# Patient Record
Sex: Female | Born: 1995 | Race: Black or African American | Hispanic: No | Smoking: Never smoker
Health system: Southern US, Community
[De-identification: ages and names within clinical notes are randomized; demographics above are authoritative.]

## PROBLEM LIST (undated history)

## (undated) ENCOUNTER — Ambulatory Visit: Source: Home / Self Care

## (undated) DIAGNOSIS — F909 Attention-deficit hyperactivity disorder, unspecified type: Secondary | ICD-10-CM

## (undated) HISTORY — PX: NO PAST SURGERIES: SHX2092

## (undated) HISTORY — DX: Attention-deficit hyperactivity disorder, unspecified type: F90.9

---
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
## 2016-02-16 ENCOUNTER — Ambulatory Visit (INDEPENDENT_AMBULATORY_CARE_PROVIDER_SITE_OTHER): Payer: Federal, State, Local not specified - PPO | Admitting: *Deleted

## 2016-02-16 DIAGNOSIS — Z308 Encounter for other contraceptive management: Secondary | ICD-10-CM | POA: Diagnosis not present

## 2016-02-16 MED ORDER — MEDROXYPROGESTERONE ACETATE 150 MG/ML IM SUSP
150.0000 mg | Freq: Once | INTRAMUSCULAR | Status: AC
  Administered 2016-02-16: 150 mg via INTRAMUSCULAR

## 2016-02-17 DIAGNOSIS — K080 Exfoliation of teeth due to systemic causes: Secondary | ICD-10-CM | POA: Diagnosis not present

## 2016-05-03 ENCOUNTER — Ambulatory Visit (INDEPENDENT_AMBULATORY_CARE_PROVIDER_SITE_OTHER): Payer: Federal, State, Local not specified - PPO | Admitting: Family Medicine

## 2016-05-03 DIAGNOSIS — Z3042 Encounter for surveillance of injectable contraceptive: Secondary | ICD-10-CM

## 2016-05-03 MED ORDER — MEDROXYPROGESTERONE ACETATE 150 MG/ML IM SUSY
150.0000 mg | PREFILLED_SYRINGE | Freq: Once | INTRAMUSCULAR | Status: AC
  Administered 2016-05-03: 150 mg via INTRAMUSCULAR

## 2016-05-03 NOTE — Progress Notes (Signed)
IM depo

## 2016-05-08 ENCOUNTER — Ambulatory Visit: Payer: Federal, State, Local not specified - PPO

## 2016-07-19 ENCOUNTER — Ambulatory Visit (INDEPENDENT_AMBULATORY_CARE_PROVIDER_SITE_OTHER): Payer: Federal, State, Local not specified - PPO | Admitting: *Deleted

## 2016-07-19 DIAGNOSIS — Z3042 Encounter for surveillance of injectable contraceptive: Secondary | ICD-10-CM | POA: Diagnosis not present

## 2016-07-19 MED ORDER — MEDROXYPROGESTERONE ACETATE 150 MG/ML IM SUSP
150.0000 mg | Freq: Once | INTRAMUSCULAR | Status: AC
  Administered 2016-07-19: 150 mg via INTRAMUSCULAR

## 2016-07-25 ENCOUNTER — Encounter: Payer: Self-pay | Admitting: Family Medicine

## 2016-07-25 ENCOUNTER — Ambulatory Visit (INDEPENDENT_AMBULATORY_CARE_PROVIDER_SITE_OTHER): Payer: Federal, State, Local not specified - PPO | Admitting: Family Medicine

## 2016-07-25 VITALS — BP 102/68 | HR 71 | Temp 98.8°F | Ht 68.75 in | Wt 142.5 lb

## 2016-07-25 DIAGNOSIS — L03115 Cellulitis of right lower limb: Secondary | ICD-10-CM | POA: Diagnosis not present

## 2016-07-25 MED ORDER — DOXYCYCLINE HYCLATE 100 MG PO TABS: 100.0000 mg | ORAL_TABLET | Freq: Two times a day (BID) | ORAL | 0 refills | Status: DC

## 2016-07-25 NOTE — Progress Notes (Signed)
Pre visit review using our clinic review tool, if applicable. No additional management support is needed unless otherwise documented below in the visit note. 

## 2016-07-25 NOTE — Patient Instructions (Signed)
You have a skin infection -take the doxycycline twice daily for 10 days  Keep the area clean with soap and water  Cover lightly  Antibiotic ointment is ok  It may drain more  If it increases in size/pain/ redness or drainage- alert us  Keep leg elevated when you can  Try a gentle warm compress on it for 10 minutes at a time when you can   Update if not starting to improve in a week or if worsening
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
## 2016-07-28 NOTE — Assessment & Plan Note (Signed)
Area of induration and erythema w/o fluctuance or drainage on lateral lower R leg  tx with doxycycline  Warm compress and elevation prn  Update if not starting to improve in a week or if worsening  -esp if inc in size/pain or if fever develops

## 2016-10-02 DIAGNOSIS — K080 Exfoliation of teeth due to systemic causes: Secondary | ICD-10-CM | POA: Diagnosis not present

## 2016-10-22 ENCOUNTER — Telehealth: Payer: Self-pay | Admitting: Family Medicine

## 2016-10-22 NOTE — Telephone Encounter (Signed)
Pt called to find out when she needs to schedule her next depo shot.  Can you please call her.

## 2016-10-22 NOTE — Telephone Encounter (Signed)
Spoke to pt and advised of next injection. Pt is outside of window and was advised a pregnancy test will be required; injection scheduled.

## 2016-10-25 ENCOUNTER — Ambulatory Visit (INDEPENDENT_AMBULATORY_CARE_PROVIDER_SITE_OTHER): Payer: Federal, State, Local not specified - PPO

## 2016-10-25 ENCOUNTER — Encounter (INDEPENDENT_AMBULATORY_CARE_PROVIDER_SITE_OTHER): Payer: Self-pay

## 2016-10-25 ENCOUNTER — Ambulatory Visit: Payer: Federal, State, Local not specified - PPO

## 2016-10-25 DIAGNOSIS — Z3202 Encounter for pregnancy test, result negative: Secondary | ICD-10-CM

## 2016-10-25 DIAGNOSIS — Z3042 Encounter for surveillance of injectable contraceptive: Secondary | ICD-10-CM | POA: Diagnosis not present

## 2016-10-25 LAB — POCT URINE PREGNANCY: Preg Test, Ur: NEGATIVE

## 2016-10-25 MED ORDER — MEDROXYPROGESTERONE ACETATE 150 MG/ML IM SUSY
150.0000 mg | PREFILLED_SYRINGE | Freq: Once | INTRAMUSCULAR | Status: AC
  Administered 2016-10-25: 150 mg via INTRAMUSCULAR

## 2016-11-20 ENCOUNTER — Encounter (HOSPITAL_COMMUNITY): Payer: Self-pay | Admitting: Emergency Medicine

## 2016-11-20 ENCOUNTER — Emergency Department (HOSPITAL_COMMUNITY)
Admission: EM | Admit: 2016-11-20 | Discharge: 2016-11-21 | Disposition: A | Discharge status: Home / Self Care | Payer: No Typology Code available for payment source | Attending: Emergency Medicine | Admitting: Emergency Medicine

## 2016-11-20 DIAGNOSIS — Z79899 Other long term (current) drug therapy: Secondary | ICD-10-CM | POA: Insufficient documentation

## 2016-11-20 DIAGNOSIS — F909 Attention-deficit hyperactivity disorder, unspecified type: Secondary | ICD-10-CM | POA: Insufficient documentation

## 2016-11-20 DIAGNOSIS — M7918 Myalgia, other site: Secondary | ICD-10-CM

## 2016-11-20 DIAGNOSIS — M549 Dorsalgia, unspecified: Secondary | ICD-10-CM | POA: Diagnosis not present

## 2016-11-20 DIAGNOSIS — Y939 Activity, unspecified: Secondary | ICD-10-CM | POA: Diagnosis not present

## 2016-11-20 DIAGNOSIS — Y999 Unspecified external cause status: Secondary | ICD-10-CM | POA: Insufficient documentation

## 2016-11-20 DIAGNOSIS — S299XXA Unspecified injury of thorax, initial encounter: Secondary | ICD-10-CM | POA: Insufficient documentation

## 2016-11-20 DIAGNOSIS — Y92410 Unspecified street and highway as the place of occurrence of the external cause: Secondary | ICD-10-CM | POA: Diagnosis not present

## 2016-11-20 DIAGNOSIS — M546 Pain in thoracic spine: Secondary | ICD-10-CM | POA: Diagnosis not present
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
## 2016-11-21 ENCOUNTER — Emergency Department (HOSPITAL_COMMUNITY): Payer: No Typology Code available for payment source

## 2016-11-21 DIAGNOSIS — M549 Dorsalgia, unspecified: Secondary | ICD-10-CM | POA: Diagnosis not present

## 2016-11-21 DIAGNOSIS — S299XXA Unspecified injury of thorax, initial encounter: Secondary | ICD-10-CM | POA: Diagnosis not present

## 2016-11-21 MED ORDER — IBUPROFEN 800 MG PO TABS: 800.0000 mg | ORAL_TABLET | Freq: Three times a day (TID) | ORAL | 0 refills | Status: DC

## 2016-11-21 MED ORDER — METHOCARBAMOL 500 MG PO TABS: 500.0000 mg | ORAL_TABLET | Freq: Two times a day (BID) | ORAL | 0 refills | Status: DC
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
## 2016-11-21 NOTE — ED Notes (Signed)
Patient transported to X-ray 

## 2017-01-16 ENCOUNTER — Ambulatory Visit: Payer: Federal, State, Local not specified - PPO

## 2017-01-16 ENCOUNTER — Ambulatory Visit (INDEPENDENT_AMBULATORY_CARE_PROVIDER_SITE_OTHER): Payer: Federal, State, Local not specified - PPO

## 2017-01-16 DIAGNOSIS — Z3042 Encounter for surveillance of injectable contraceptive: Secondary | ICD-10-CM

## 2017-01-16 MED ORDER — MEDROXYPROGESTERONE ACETATE 150 MG/ML IM SUSP
150.0000 mg | Freq: Once | INTRAMUSCULAR | Status: AC
  Administered 2017-01-16: 150 mg via INTRAMUSCULAR

## 2017-01-17 ENCOUNTER — Ambulatory Visit: Payer: Federal, State, Local not specified - PPO

## 2017-04-03 ENCOUNTER — Ambulatory Visit: Payer: Federal, State, Local not specified - PPO

## 2017-04-04 ENCOUNTER — Ambulatory Visit: Payer: Federal, State, Local not specified - PPO

## 2017-04-09 ENCOUNTER — Ambulatory Visit (INDEPENDENT_AMBULATORY_CARE_PROVIDER_SITE_OTHER): Payer: Federal, State, Local not specified - PPO | Admitting: *Deleted

## 2017-04-09 DIAGNOSIS — Z3042 Encounter for surveillance of injectable contraceptive: Secondary | ICD-10-CM

## 2017-04-09 MED ORDER — MEDROXYPROGESTERONE ACETATE 150 MG/ML IM SUSP
150.0000 mg | Freq: Once | INTRAMUSCULAR | Status: AC
  Administered 2017-04-09: 150 mg via INTRAMUSCULAR

## 2017-04-11 ENCOUNTER — Ambulatory Visit (INDEPENDENT_AMBULATORY_CARE_PROVIDER_SITE_OTHER): Payer: Federal, State, Local not specified - PPO | Admitting: Family Medicine

## 2017-04-11 ENCOUNTER — Encounter: Payer: Self-pay | Admitting: Family Medicine

## 2017-04-11 ENCOUNTER — Telehealth: Payer: Self-pay | Admitting: Family Medicine

## 2017-04-11 VITALS — BP 120/70 | HR 74 | Ht 68.75 in | Wt 145.0 lb

## 2017-04-11 DIAGNOSIS — Z113 Encounter for screening for infections with a predominantly sexual mode of transmission: Secondary | ICD-10-CM | POA: Diagnosis not present

## 2017-04-11 DIAGNOSIS — Z0000 Encounter for general adult medical examination without abnormal findings: Secondary | ICD-10-CM | POA: Diagnosis not present

## 2017-04-11 DIAGNOSIS — Z01419 Encounter for gynecological examination (general) (routine) without abnormal findings: Secondary | ICD-10-CM | POA: Insufficient documentation

## 2017-04-11 LAB — COMPREHENSIVE METABOLIC PANEL
ALT: 8 U/L (ref 0–35)
AST: 15 U/L (ref 0–37)
Albumin: 4.4 g/dL (ref 3.5–5.2)
Alkaline Phosphatase: 56 U/L (ref 39–117)
BUN: 10 mg/dL (ref 6–23)
CO2: 25 mEq/L (ref 19–32)
Calcium: 9.3 mg/dL (ref 8.4–10.5)
Chloride: 108 mEq/L (ref 96–112)
Creatinine, Ser: 0.81 mg/dL (ref 0.40–1.20)
GFR: 115.79 mL/min (ref >60.00)
Glucose, Bld: 90 mg/dL (ref 70–99)
Potassium: 3.9 mEq/L (ref 3.5–5.1)
Sodium: 138 mEq/L (ref 135–145)
Total Bilirubin: 0.4 mg/dL (ref 0.2–1.2)
Total Protein: 7.2 g/dL (ref 6.0–8.3)

## 2017-04-11 LAB — CBC WITH DIFFERENTIAL/PLATELET
Basophils Absolute: 0 10*3/uL (ref 0.0–0.1)
Basophils Relative: 0.8 % (ref 0.0–3.0)
Eosinophils Absolute: 0.3 10*3/uL (ref 0.0–0.7)
Eosinophils Relative: 4.5 % (ref 0.0–5.0)
HCT: 42.4 % (ref 36.0–46.0)
Hemoglobin: 13.8 g/dL (ref 12.0–15.0)
Lymphocytes Relative: 51.3 % — ABNORMAL HIGH (ref 12.0–46.0)
Lymphs Abs: 3 10*3/uL (ref 0.7–4.0)
MCHC: 32.5 g/dL (ref 30.0–36.0)
MCV: 82.1 fl (ref 78.0–100.0)
Monocytes Absolute: 0.4 10*3/uL (ref 0.1–1.0)
Monocytes Relative: 6.7 % (ref 3.0–12.0)
Neutro Abs: 2.2 10*3/uL (ref 1.4–7.7)
Neutrophils Relative %: 36.7 % — ABNORMAL LOW (ref 43.0–77.0)
Platelets: 327 10*3/uL (ref 150.0–400.0)
RBC: 5.17 Mil/uL — ABNORMAL HIGH (ref 3.87–5.11)
RDW: 13.2 % (ref 11.5–14.6)
WBC: 5.9 10*3/uL (ref 4.5–10.5)

## 2017-04-11 LAB — LIPID PANEL
Cholesterol: 117 mg/dL (ref 0–200)
HDL: 46.9 mg/dL (ref >39.00)
LDL Cholesterol: 63 mg/dL (ref 0–99)
NonHDL: 70.31
Total CHOL/HDL Ratio: 2
Triglycerides: 37 mg/dL (ref 0.0–149.0)
VLDL: 7.4 mg/dL (ref 0.0–40.0)

## 2017-04-11 LAB — TSH: TSH: 2.34 u[IU]/mL (ref 0.35–5.50)

## 2017-04-11 NOTE — Assessment & Plan Note (Signed)
Reviewed preventive care protocols, scheduled due services, and updated immunizations Discussed nutrition, exercise, diet, and healthy lifestyle.  Orders Placed This Encounter  Procedures  . CBC with Differential/Platelet  . Comprehensive metabolic panel  . Lipid panel  . TSH  . HIV antibody (with reflex)  . RPR    

## 2017-04-11 NOTE — Addendum Note (Signed)
Addended by: Dianne DunARON, TALIA M on: 04/11/2017 12:25 PM   Modules accepted: Orders

## 2017-04-11 NOTE — Patient Instructions (Signed)
Great to see you.  Start taking Pepcid 20 mg daily for the next 2 weeks.  Keep me updated.

## 2017-04-11 NOTE — Telephone Encounter (Signed)
Patient asked to be called when her lab results are received.

## 2017-04-11 NOTE — Progress Notes (Signed)
Subjective:   Patient ID: Shelby Andrews, female    DOB: [birth year], 21 y.o.   MRN: [id number]  Shelby Andrews is a pleasant 21 y.o. year old female who presents to clinic today with Annual Exam  on 04/11/2017  HPI:   Doing well on depo- not having periods.  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 Wants to be a Museum/gallery exhibitions officer.    No current outpatient prescriptions on file prior to visit.   No current facility-administered medications on file prior to visit.     No Known Allergies  Past Medical History:  Diagnosis Date  . ADHD (attention deficit hyperactivity disorder)    Formall evaluated by psychological services    No past surgical history on file.  Family History  Problem Relation Age of Onset  . Diabetes Maternal Grandmother   . Hypertension Maternal Grandmother   . Diabetes Maternal Grandfather   . Hypertension Maternal Grandfather     Social History   Social History  . Marital status: Single    Spouse name: N/A  . Number of children: N/A  . Years of education: N/A   Occupational History  . Not on file.   Social History Main Topics  . Smoking status: Never Smoker  . Smokeless tobacco: Never Used  . Alcohol use No  . Drug use: No  . Sexual activity: No   Other Topics Concern  . 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   Runs track   The PMH, PSH, Social History, Family History, Medications, and allergies have been reviewed in [facility], and have been updated if relevant.   Review of Systems  Constitutional: Negative.   HENT: Negative.   Respiratory: Negative.   Cardiovascular: Negative.   Gastrointestinal: Negative for anal bleeding, blood in stool, constipation, diarrhea, nausea, rectal pain and vomiting.       +GERD  Endocrine: Negative.   Genitourinary: Negative.   Musculoskeletal: Negative.   Allergic/Immunologic: Negative.     Neurological: Negative.   Hematological: Negative.   Psychiatric/Behavioral: Negative.   All other systems reviewed and are negative.      Objective:    BP 120/70   Pulse 74   Ht 5' 8.75" (1.746 m)   Wt 145 lb (65.8 kg)   SpO2 99%   BMI 21.57 kg/m    Physical Exam   General:  Well-developed,well-nourished,in no acute distress; alert,appropriate and cooperative throughout examination Head:  normocephalic and atraumatic.   Eyes:  vision grossly intact, PERRL Ears:  R ear normal and L ear normal externally, TMs clear bilaterally Nose:  no external deformity.   Mouth:  good dentition.   Neck:  No deformities, masses, or tenderness noted. Breasts:  No mass, nodules, thickening, tenderness, bulging, retraction, inflamation, nipple discharge or skin changes noted.   Lungs:  Normal respiratory effort, chest expands symmetrically. Lungs are clear to auscultation, no crackles or wheezes. Heart:  Normal rate and regular rhythm. S1 and S2 normal without gallop, murmur, click, rub or other extra sounds. Abdomen:  Bowel sounds positive,abdomen soft and non-tender without masses, organomegaly or hernias noted. Msk:  No deformity or scoliosis noted of thoracic or lumbar spine.   Extremities:  No clubbing, cyanosis, edema, or deformity noted with normal full range of motion of all joints.   Neurologic:  alert & oriented X3  and gait normal.   Skin:  Intact without suspicious lesions or rashes Cervical Nodes:  No lymphadenopathy noted Axillary Nodes:  No palpable lymphadenopathy Psych:  Cognition and judgment appear intact. Alert and cooperative with normal attention span and concentration. No apparent delusions, illusions, hallucinations       Assessment & Plan:   Well woman exam - Plan: CBC with Differential/Platelet, Comprehensive metabolic panel, Lipid panel, TSH  Screening examination for STD (sexually transmitted disease) - Plan: HIV antibody (with reflex), RPR No Follow-up on  file.

## 2017-04-11 NOTE — Assessment & Plan Note (Signed)
HIV and RPR today.

## 2017-04-12 ENCOUNTER — Encounter: Payer: Self-pay | Admitting: *Deleted

## 2017-04-12 LAB — RPR

## 2017-04-12 LAB — HIV ANTIBODY (ROUTINE TESTING W REFLEX): HIV 1&2 Ab, 4th Generation: NONREACTIVE

## 2017-04-16 NOTE — Telephone Encounter (Signed)
Please see result note and call patient

## 2017-04-16 NOTE — Telephone Encounter (Signed)
LVM for CB:  Please let pt know that her cholesterol, cbc, thyroid function, liver function, kidney function and electrolytes look excellent. Keep up the good work. HIV and RPR neg as well.

## 2017-05-20 DIAGNOSIS — K080 Exfoliation of teeth due to systemic causes: Secondary | ICD-10-CM | POA: Diagnosis not present

## 2017-06-25 ENCOUNTER — Ambulatory Visit (INDEPENDENT_AMBULATORY_CARE_PROVIDER_SITE_OTHER): Payer: Federal, State, Local not specified - PPO

## 2017-06-25 DIAGNOSIS — Z3042 Encounter for surveillance of injectable contraceptive: Secondary | ICD-10-CM

## 2017-06-25 MED ORDER — MEDROXYPROGESTERONE ACETATE 150 MG/ML IM SUSY
150.0000 mg | PREFILLED_SYRINGE | Freq: Once | INTRAMUSCULAR | Status: AC
  Administered 2017-06-25: 150 mg via INTRAMUSCULAR

## 2017-09-10 ENCOUNTER — Ambulatory Visit: Payer: Federal, State, Local not specified - PPO | Admitting: Family Medicine

## 2017-09-10 ENCOUNTER — Ambulatory Visit: Payer: Federal, State, Local not specified - PPO

## 2017-09-10 ENCOUNTER — Encounter: Payer: Self-pay | Admitting: Family Medicine

## 2017-09-10 VITALS — BP 122/84 | HR 96 | Temp 98.7°F | Ht 68.75 in | Wt 149.4 lb

## 2017-09-10 DIAGNOSIS — Z23 Encounter for immunization: Secondary | ICD-10-CM

## 2017-09-10 DIAGNOSIS — R1030 Lower abdominal pain, unspecified: Secondary | ICD-10-CM | POA: Diagnosis not present

## 2017-09-10 DIAGNOSIS — Z309 Encounter for contraceptive management, unspecified: Secondary | ICD-10-CM

## 2017-09-10 DIAGNOSIS — R109 Unspecified abdominal pain: Secondary | ICD-10-CM | POA: Insufficient documentation

## 2017-09-10 MED ORDER — MEDROXYPROGESTERONE ACETATE 150 MG/ML IM SUSY
150.0000 mg | PREFILLED_SYRINGE | Freq: Once | INTRAMUSCULAR | Status: AC
  Administered 2017-09-10: 150 mg via INTRAMUSCULAR

## 2017-09-10 NOTE — Progress Notes (Signed)
Subjective:   Patient ID: Shelby Andrews, female    DOB: 11-Mar-1996, 22 y.o.   MRN: [id number]  Shelby Andrews is a pleasant 22 y.o. year old female who presents to clinic today with Abdominal Pain (Patient is here today C/O mid-abd pain x3wks.  She states that it starts in the middle of her stomach then moves down to pelvis and changes to cramps.  Denies any constipation or diarrhea.  Has been eating more chicken and salad.) and Contraception Management (Patient is also here today to get her Depo shot.  Her last injection was 10.23.2018 which makes it due 1.8.19-1.22.2019.)  on 09/10/2017  HPI:  Here for IM depo.  Not sexually active. Feels this is working well.  Has had more gas and intermittent abdominal pain since she changed her diet a few weeks ago.  More salad. No changes in bowel habits or blood in her stool.  No vomiting. No black stools.  Current Outpatient Medications on File Prior to Visit  Medication Sig Dispense Refill  . medroxyPROGESTERone (DEPO-PROVERA) 150 MG/ML injection Inject 150 mg into the muscle every 3 (three) months.     No current facility-administered medications on file prior to visit.     No Known Allergies  Past Medical History:  Diagnosis Date  . ADHD (attention deficit hyperactivity disorder)    Formall evaluated by psychological services    No past surgical history on file.  Family History  Problem Relation Age of Onset  . Diabetes Maternal Grandmother   . Hypertension Maternal Grandmother   . Diabetes Maternal Grandfather   . Hypertension Maternal Grandfather     Social History   Socioeconomic History  . Marital status: Single    Spouse name: Not on file  . Number of children: Not on file  . Years of education: Not on file  . Highest education level: Not on file  Social Needs  . Financial resource strain: Not on file  . Food insecurity - worry: Not on file  . Food insecurity - inability: Not on file  . Transportation needs -  medical: Not on file  . Transportation needs - non-medical: Not on file  Occupational History  . Not on file  Tobacco Use  . Smoking status: Never Smoker  . Smokeless tobacco: Never Used  Substance and Sexual Activity  . Alcohol use: No  . Drug use: No  . Sexual activity: No  Other Topics Concern  . 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   Runs track   The PMH, PSH, Social History, Family History, Medications, and allergies have been reviewed in [facility], and have been updated if relevant.   Review of Systems  Gastrointestinal: Positive for abdominal distention. Negative for abdominal pain, anal bleeding, blood in stool, constipation, diarrhea, nausea, rectal pain and vomiting.  All other systems reviewed and are negative.      Objective:    BP 122/84 (BP Location: Left Arm, Patient Position: Sitting, Cuff Size: Normal)   Pulse 96   Temp 98.7 F (37.1 C) (Oral)   Ht 5' 8.75" (1.746 m)   Wt 149 lb 6.4 oz (67.8 kg)   SpO2 97%   BMI 22.22 kg/m    Physical Exam  Constitutional: She is oriented to person, place, and time. She appears well-developed and well-nourished. No distress.  HENT:  Head: Normocephalic and atraumatic.  Eyes: Conjunctivae are normal.  Cardiovascular: Normal rate.  Pulmonary/Chest: Effort normal.  Abdominal: Soft. Bowel sounds are normal. She exhibits no distension and no mass. There is no tenderness. There is no rebound and no guarding.  Musculoskeletal: Normal range of motion.  Neurological: She is alert and oriented to person, place, and time. No cranial nerve deficit.  Skin: Skin is warm and dry. She is not diaphoretic.  Psychiatric: She has a normal mood and affect. Her behavior is normal. Judgment and thought content normal.  Nursing note and vitals reviewed.         Assessment & Plan:   Need for influenza vaccination - Plan: Flu Vaccine QUAD 6+ mos  PF IM (Fluarix Quad PF)  Encounter for contraceptive management, unspecified type No Follow-up on file.

## 2017-09-10 NOTE — Assessment & Plan Note (Signed)
Intermittent.  Exam and history reassuring. Likely gas pain from increased fresh veggies. Advised gas x, beano as needed. Call or return to clinic prn if these symptoms worsen or fail to improve as anticipated. The patient indicates understanding of these issues and agrees with the plan.

## 2017-09-10 NOTE — Progress Notes (Signed)
Next dose due 3.26.19-4.09.19/pt aware/thx dmf

## 2017-09-10 NOTE — Patient Instructions (Signed)
Great to see you. Happy New Year!   Try Gas X or Beano and keep me updated.

## 2017-09-10 NOTE — Assessment & Plan Note (Signed)
IM depo given today. 

## 2017-10-24 ENCOUNTER — Ambulatory Visit: Payer: Self-pay | Admitting: *Deleted

## 2017-10-24 NOTE — Telephone Encounter (Signed)
Pt reports vaginal bleeding. Spotting x 3 days; last night "large amount when went to BR."  Bright red, no clots. No bleeding this AM. On Depo- Provera, last injection 09/10/17.  Does not need to wear pads, states only spotting when goes to BR. No urinary symptoms. Does report abdominal "like cramping" lower abdominal area, below umbilicus. 6/10 last night, 3/10 this am,intermittent. Mild dizziness last night, not this AM. States has not had any spotting, bleeding x 3 years.  Also reports "mild" cold symptoms; non-productive occasional cough. Pt requesting to see Dr. Dayton MartesAron. Appt made by agent prior to triage.  Home care advice given; instructed to call back if symptoms worsen; increased bleeding, cramping, dizziness, weakness.  Reason for Disposition . [1] Bleeding or spotting between regular periods AND [2] occurs more than two cycles (2 months) AND [3] using birth control medicine (pills, patch, Depo-Provera, Implanon, vaginal ring, Mirena IUD)  Answer Assessment - Initial Assessment Questions 1. AMOUNT: "Describe the bleeding that you are having."    - SPOTTING: spotting, or pinkish / brownish mucous discharge; does not fill panti-liner or pad    - MILD:  less than 1 pad / hour; less than patient's usual menstrual bleeding   - MODERATE: 1-2 pads / hour; small-medium blood clots (e.g., pea, grape, small coin)    - SEVERE: soaking 2 or more pads/hour for 2 or more hours; bleeding not contained by pads or continuous red blood from vagina; large blood clots (e.g., golf ball, large coin)     Does not have to wear pad; Only occurs when going to bathroom; 1 episode last night, large amount 2. ONSET: "When did the bleeding begin?" "Is it continuing now?"     2 days ago. None presently 3. MENSTRUAL PERIOD: "When was the last normal menstrual period?" "How is this different than your period?"     On Depo-Provera.  3 years ago: Last injection 09/10/17 4. REGULARITY: "How regular are your periods?"     n/a 5.  ABDOMINAL PAIN: "Do you have any pain?" "How bad is the pain?"  (e.g., Scale 1-10; mild, moderate, or severe)   - MILD (1-3): doesn't interfere with normal activities, abdomen soft and not tender to touch    - MODERATE (4-7): interferes with normal activities or awakens from sleep, tender to touch    - SEVERE (8-10): excruciating pain, doubled over, unable to do any normal activities     Lower abdominal cramping, below umbilicus. 6/10 last night. This am 3/10 6. PREGNANCY: "Could you be pregnant?" "Are you sexually active?" "Did you recently give birth?"     No 7. BREASTFEEDING: "Are you breastfeeding?"     Np 8. HORMONES: "Are you taking any hormone medications, prescription or OTC?" (e.g., birth control pills, estrogen)     Depo 9. BLOOD THINNERS: "Do you take any blood thinners?" (e.g., Coumadin/warfarin, Pradaxa/dabigatran, aspirin)    no 10. CAUSE: "What do you think is causing the bleeding?" (e.g., recent gyn surgery, recent gyn procedure; known bleeding disorder, cervical cancer, polycystic ovarian disease, fibroids)         Unsure 11. HEMODYNAMIC STATUS: "Are you weak or feeling lightheaded?" If so, ask: "Can you stand and walk normally?"        Mild dizziness yesterday, none today 12. OTHER SYMPTOMS: "What other symptoms are you having with the bleeding?" (e.g., passed tissue, vaginal discharge, fever, menstrual-type cramps)      Cold symptoms presented 3 days before vaginal bleeding. Nonproductive cough, mild.  Protocols used:  VAGINAL BLEEDING - ABNORMAL-A-AH

## 2017-10-28 ENCOUNTER — Ambulatory Visit (HOSPITAL_BASED_OUTPATIENT_CLINIC_OR_DEPARTMENT_OTHER): Payer: Federal, State, Local not specified - PPO

## 2017-10-28 ENCOUNTER — Ambulatory Visit: Payer: Federal, State, Local not specified - PPO | Admitting: Family Medicine

## 2017-10-28 ENCOUNTER — Encounter: Payer: Self-pay | Admitting: Family Medicine

## 2017-10-28 ENCOUNTER — Ambulatory Visit (HOSPITAL_BASED_OUTPATIENT_CLINIC_OR_DEPARTMENT_OTHER)
Admission: RE | Admit: 2017-10-28 | Discharge: 2017-10-28 | Disposition: A | Discharge status: Home / Self Care | Payer: Federal, State, Local not specified - PPO | Source: Ambulatory Visit | Attending: Family Medicine | Admitting: Family Medicine

## 2017-10-28 VITALS — BP 118/82 | HR 90 | Temp 98.3°F | Ht 68.75 in | Wt 152.6 lb

## 2017-10-28 DIAGNOSIS — R1030 Lower abdominal pain, unspecified: Secondary | ICD-10-CM | POA: Diagnosis not present

## 2017-10-28 DIAGNOSIS — R102 Pelvic and perineal pain: Secondary | ICD-10-CM | POA: Diagnosis not present

## 2017-10-28 NOTE — Patient Instructions (Signed)
Great to see you. [facility] your way out.

## 2017-10-28 NOTE — Assessment & Plan Note (Signed)
?  ovarian cyst? Will evaluate further with transvaginal/pelvic US. The patient indicates understanding of these issues and agrees with the plan.

## 2017-10-28 NOTE — Progress Notes (Signed)
Subjective:   Patient ID: Shelby Andrews, female    DOB: 26-Dec-1995, 21 y.o.   MRN: [id number]  Shelby Andrews is a pleasant 22 y.o. year old female who presents to clinic today with Abdominal Pain (Patient is here today C/O abdominal pain.  It is located just below umbillical area all the way across and sometimes up the left side.  Abd pain started on 2.18.19.   Her period started on 2.16.19-2.20.19.  Her BM's are normal.)  on 10/28/2017  HPI:  Abdominal pain- Started on 10/21/17 while she was on her period. On IM depo provera every 3 months and has not missed a dose.  This is the first period she has had in 3 years and has had left sided abdominal pain since.  Bleeding has stopped.  Has not had sex in over 2 years.  No changes in bowel habits. No nausea or vomiting.  Pain is intermittent.  9/10 at it's worse. Better now that the bleeding has stopped but still there. Worse when she sits up. Current Outpatient Medications on File Prior to Visit  Medication Sig Dispense Refill  . medroxyPROGESTERone (DEPO-PROVERA) 150 MG/ML injection Inject 150 mg into the muscle every 3 (three) months.     No current facility-administered medications on file prior to visit.     No Known Allergies  Past Medical History:  Diagnosis Date  . ADHD (attention deficit hyperactivity disorder)    Formall evaluated by psychological services    No past surgical history on file.  Family History  Problem Relation Age of Onset  . Diabetes Maternal Grandmother   . Hypertension Maternal Grandmother   . Diabetes Maternal Grandfather   . Hypertension Maternal Grandfather     Social History   Socioeconomic History  . Marital status: Single    Spouse name: Not on file  . Number of children: Not on file  . Years of education: Not on file  . Highest education level: Not on file  Social Needs  . Financial resource strain: Not on file  . Food insecurity - worry: Not on file  . Food insecurity -  inability: Not on file  . Transportation needs - medical: Not on file  . Transportation needs - non-medical: Not on file  Occupational History  . Not on file  Tobacco Use  . Smoking status: Never Smoker  . Smokeless tobacco: Never Used  Substance and Sexual Activity  . Alcohol use: No  . Drug use: No  . Sexual activity: No  Other Topics Concern  . 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   Runs track   The PMH, PSH, Social History, Family History, Medications, and allergies have been [facility], and have been updated if relevant.   Review of Systems  Constitutional: Negative.   Gastrointestinal: Positive for abdominal pain. Negative for abdominal distention, anal bleeding, blood in stool, constipation, diarrhea, nausea and rectal pain.  Genitourinary: Positive for menstrual problem.  All other systems reviewed and are negative.      Objective:    BP 118/82 (BP Location: Left Arm, Patient Position: Sitting, Cuff Size: Normal)   Pulse 90   Temp 98.3 F (36.8 C) (Oral)   Ht 5' 8.75" (1.746 m)   Wt 152 lb 9.6 oz (69.2 kg)   LMP 10/19/2017   SpO2 98%   BMI 22.70 kg/m  Physical Exam  Constitutional: She is oriented to person, place, and time. She appears well-developed and well-nourished. No distress.  HENT:  Head: Normocephalic and atraumatic.  Cardiovascular: Normal rate.  Pulmonary/Chest: Effort normal.  Abdominal: Soft. She exhibits no distension. There is no tenderness. There is no rebound and no guarding.  Musculoskeletal: Normal range of motion.  Neurological: She is alert and oriented to person, place, and time. No cranial nerve deficit.  Skin: Skin is warm and dry. She is not diaphoretic.  Psychiatric: She has a normal mood and affect. Her behavior is normal. Judgment and thought content normal.  Nursing note and vitals reviewed.         Assessment & Plan:     Lower abdominal pain - Plan: US Transvaginal Non-OB, US Pelvis Complete No Follow-up on file.

## 2017-10-29 ENCOUNTER — Telehealth: Payer: Self-pay | Admitting: Family Medicine

## 2017-10-29 NOTE — Telephone Encounter (Signed)
Copied from CRM [id number]. Topic: Quick Communication - See Telephone Encounter >> Oct 29, 2017 10:50 AM Cipriano BunkerLambe, Annette S wrote: CRM for notification. See Telephone encounter for:    Pt. Calling for results of [facility] [employer].   10/29/17.

## 2017-10-30 ENCOUNTER — Ambulatory Visit: Payer: Federal, State, Local not specified - PPO | Admitting: Family Medicine

## 2017-10-30 NOTE — Telephone Encounter (Signed)
Pt viewed results via MyChart/thx dmf

## 2017-11-26 ENCOUNTER — Ambulatory Visit: Payer: Federal, State, Local not specified - PPO

## 2017-11-26 ENCOUNTER — Ambulatory Visit (INDEPENDENT_AMBULATORY_CARE_PROVIDER_SITE_OTHER): Payer: Federal, State, Local not specified - PPO | Admitting: Emergency Medicine

## 2017-11-26 DIAGNOSIS — Z309 Encounter for contraceptive management, unspecified: Secondary | ICD-10-CM | POA: Diagnosis not present

## 2017-11-26 MED ORDER — MEDROXYPROGESTERONE ACETATE 150 MG/ML IM SUSP
150.0000 mg | Freq: Once | INTRAMUSCULAR | Status: AC
  Administered 2017-11-26: 150 mg via INTRAMUSCULAR

## 2017-11-26 NOTE — Progress Notes (Signed)
Patient came into the office to receive injection for birth control (Depo-Provera). Patient receive injection in the left outer quadrant and tolerated the injection well. Patient was given information regarding the next time she is due for the next injection. Patient states that she will give the office a call to schedule next injection.

## 2018-01-16 DIAGNOSIS — K080 Exfoliation of teeth due to systemic causes: Secondary | ICD-10-CM | POA: Diagnosis not present

## 2018-02-13 ENCOUNTER — Ambulatory Visit (INDEPENDENT_AMBULATORY_CARE_PROVIDER_SITE_OTHER): Payer: Federal, State, Local not specified - PPO

## 2018-02-13 DIAGNOSIS — Z309 Encounter for contraceptive management, unspecified: Secondary | ICD-10-CM

## 2018-02-13 MED ORDER — MEDROXYPROGESTERONE ACETATE 150 MG/ML IM SUSP
150.0000 mg | Freq: Once | INTRAMUSCULAR | Status: AC
  Administered 2018-02-13: 150 mg via INTRAMUSCULAR

## 2018-02-13 NOTE — Progress Notes (Signed)
Patient given Depo-Provera 150mg /mL 1mL given in RUQ Glute/She tolerated well/Next inj due 8.29.19-9.12.19 and pt agreed to schedule this/thx dmf

## 2018-04-28 ENCOUNTER — Other Ambulatory Visit (HOSPITAL_COMMUNITY)
Admission: RE | Admit: 2018-04-28 | Discharge: 2018-04-28 | Disposition: A | Discharge status: Home / Self Care | Payer: Federal, State, Local not specified - PPO | Source: Ambulatory Visit | Attending: Family Medicine | Admitting: Family Medicine

## 2018-04-28 ENCOUNTER — Ambulatory Visit (INDEPENDENT_AMBULATORY_CARE_PROVIDER_SITE_OTHER): Payer: Federal, State, Local not specified - PPO | Admitting: Family Medicine

## 2018-04-28 ENCOUNTER — Encounter: Payer: Self-pay | Admitting: Family Medicine

## 2018-04-28 VITALS — BP 114/78 | HR 93 | Temp 99.1°F | Ht 69.25 in | Wt 154.4 lb

## 2018-04-28 DIAGNOSIS — N898 Other specified noninflammatory disorders of vagina: Secondary | ICD-10-CM | POA: Insufficient documentation

## 2018-04-28 DIAGNOSIS — Z23 Encounter for immunization: Secondary | ICD-10-CM

## 2018-04-28 DIAGNOSIS — Z01419 Encounter for gynecological examination (general) (routine) without abnormal findings: Secondary | ICD-10-CM

## 2018-04-28 DIAGNOSIS — R87610 Atypical squamous cells of undetermined significance on cytologic smear of cervix (ASC-US): Secondary | ICD-10-CM | POA: Insufficient documentation

## 2018-04-28 DIAGNOSIS — Z130 Encounter for screening for diseases of the blood and blood-forming organs and certain disorders involving the immune mechanism: Secondary | ICD-10-CM | POA: Diagnosis not present

## 2018-04-28 DIAGNOSIS — Z793 Long term (current) use of hormonal contraceptives: Secondary | ICD-10-CM

## 2018-04-28 DIAGNOSIS — Z01411 Encounter for gynecological examination (general) (routine) with abnormal findings: Secondary | ICD-10-CM | POA: Diagnosis not present

## 2018-04-28 DIAGNOSIS — Z113 Encounter for screening for infections with a predominantly sexual mode of transmission: Secondary | ICD-10-CM | POA: Diagnosis not present

## 2018-04-28 DIAGNOSIS — Z1151 Encounter for screening for human papillomavirus (HPV): Secondary | ICD-10-CM | POA: Diagnosis not present

## 2018-04-28 NOTE — Patient Instructions (Signed)
Great to see you. I will call you with your results from today and you can view them online.   

## 2018-04-28 NOTE — Assessment & Plan Note (Signed)
Reviewed preventive care protocols, scheduled due services, and updated immunizations Discussed nutrition, exercise, diet, and healthy lifestyle.  Pap smear done today. 

## 2018-04-28 NOTE — Assessment & Plan Note (Signed)
HIV, RPR labs ordered, will send GC/CHalmydia, trichomonas swab with pap smear.  Also discussed sexual activity, pregnancy risk, and STD risk.  Encouraged to get regular exercise and a balanced diet.  Discussed immunizations and they have also been updated in the chart.

## 2018-04-28 NOTE — Progress Notes (Signed)
Subjective:   Patient ID: Shelby Andrews, female    DOB: Jun 23, 1996, 22 y.o.   MRN: [id number]  Shelby Andrews is a pleasant 22 y.o. year old female who presents to clinic today with Annual Exam (Patient is here today for a CPE with PAP.  She would like screening for STD's  and she states that she has vaginal discharge.  She has been fasting for 6 hours.  She agrees to get her Tdap today.  Depo-Provera due in September.)  on 04/28/2018  HPI:  Has never had a pap smear. On Depo IM every 3 months for contraception and menorrhagia, next injection due in 05/2018. She has had some intermittent vaginal discharge and would like STD screening done today. She is sexually active but uses condoms with each encounter as well.    Current Outpatient Medications on File Prior to Visit  Medication Sig Dispense Refill  . medroxyPROGESTERone (DEPO-PROVERA) 150 MG/ML injection Inject 150 mg into the muscle every 3 (three) months.     No current facility-administered medications on file prior to visit.     No Known Allergies  Past Medical History:  Diagnosis Date  . ADHD (attention deficit hyperactivity disorder)    Formall evaluated by psychological services    No past surgical history on file.  Family History  Problem Relation Age of Onset  . Diabetes Maternal Grandmother   . Hypertension Maternal Grandmother   . Diabetes Maternal Grandfather   . Hypertension Maternal Grandfather     Social History   Socioeconomic History  . Marital status: Single    Spouse name: Not on file  . Number of children: Not on file  . Years of education: Not on file  . Highest education level: Not on file  Occupational History  . Not on file  Social Needs  . Financial resource strain: Not on file  . Food insecurity:    Worry: Not on file    Inability: Not on file  . Transportation needs:    Medical: Not on file    Non-medical: Not on file  Tobacco Use  . Smoking status: Never Smoker  .  Smokeless tobacco: Never Used  Substance and Sexual Activity  . Alcohol use: No  . Drug use: No  . Sexual activity: Never  Lifestyle  . Physical activity:    Days per week: Not on file    Minutes per session: Not on file  . Stress: Not on file  Relationships  . Social connections:    Talks on phone: Not on file    Gets together: Not on file    Attends religious service: Not on file    Active member of club or organization: Not on file    Attends meetings of clubs or organizations: Not on file    Relationship status: Not on file  . Intimate partner violence:    Fear of current or ex partner: Not on file    Emotionally abused: Not on file    Physically abused: Not on file    Forced sexual activity: Not on file  Other Topics Concern  . 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   Runs track   The PMH, PSH, Social History, Family History, Medications, and allergies have been reviewed in [facility], and have been updated if relevant.  Review of Systems  Constitutional: Negative.  HENT: Negative.   Eyes: Negative.   Respiratory: Negative.   Cardiovascular: Negative.   Gastrointestinal: Negative.   Endocrine: Negative.   Genitourinary: Positive for vaginal discharge. Negative for decreased urine volume, genital sores, hematuria, menstrual problem, pelvic pain, urgency, vaginal bleeding and vaginal pain.  Musculoskeletal: Negative.   Skin: Negative.   Allergic/Immunologic: Negative.   Neurological: Negative.   Hematological: Negative.   Psychiatric/Behavioral: Negative.   All other systems reviewed and are negative.      Objective:    BP 114/78 (BP Location: Left Arm, Patient Position: Sitting, Cuff Size: Normal)   Pulse 93   Temp 99.1 F (37.3 C) (Oral)   Ht 5' 9.25" (1.759 m)   Wt 154 lb 6.4 oz (70 kg)   SpO2 99%   BMI 22.64 kg/m    Physical Exam   General:   Well-developed,well-nourished,in no acute distress; alert,appropriate and cooperative throughout examination Head:  normocephalic and atraumatic.   Eyes:  vision grossly intact, PERRL Ears:  R ear normal and L ear normal externally, TMs clear bilaterally Nose:  no external deformity.   Mouth:  good dentition.   Neck:  No deformities, masses, or tenderness noted. Breasts:  No mass, nodules, thickening, tenderness, bulging, retraction, inflamation, nipple discharge or skin changes noted.   Lungs:  Normal respiratory effort, chest expands symmetrically. Lungs are clear to auscultation, no crackles or wheezes. Heart:  Normal rate and regular rhythm. S1 and S2 normal without gallop, murmur, click, rub or other extra sounds. Abdomen:  Bowel sounds positive,abdomen soft and non-tender without masses, organomegaly or hernias noted. Rectal:  no external abnormalities.   Genitalia:  Pelvic Exam:        External: normal female genitalia without lesions or masses        Vagina: normal without lesions or masses        Cervix: normal without lesions or masses        Adnexa: normal bimanual exam without masses or fullness        Uterus: normal by palpation        Pap smear: performed Msk:  No deformity or scoliosis noted of thoracic or lumbar spine.   Extremities:  No clubbing, cyanosis, edema, or deformity noted with normal full range of motion of all joints.   Neurologic:  alert & oriented X3 and gait normal.   Skin:  Intact without suspicious lesions or rashes Cervical Nodes:  No lymphadenopathy noted Axillary Nodes:  No palpable lymphadenopathy Psych:  Cognition and judgment appear intact. Alert and cooperative with normal attention span and concentration. No apparent delusions, illusions, hallucinations       Assessment & Plan:   Well woman exam with routine gynecological exam - Plan: Cytology - PAP  Need for Tdap vaccination - Plan: Tdap vaccine greater than or equal to 7yo IM  Vaginal  discharge - Plan: Cytology - PAP  Screening for STD (sexually transmitted disease) - Plan: HIV antibody (with reflex), RPR  Long term (current) use of hormonal contraceptives - Plan: Comprehensive metabolic panel, CBC, TSH, Lipid panel No follow-ups on file.

## 2018-04-29 LAB — TSH: TSH: 1.51 u[IU]/mL (ref 0.35–4.50)

## 2018-04-29 LAB — LIPID PANEL
Cholesterol: 127 mg/dL (ref 0–200)
HDL: 50.3 mg/dL (ref >39.00)
LDL Cholesterol: 68 mg/dL (ref 0–99)
NonHDL: 77.15
Total CHOL/HDL Ratio: 3
Triglycerides: 48 mg/dL (ref 0.0–149.0)
VLDL: 9.6 mg/dL (ref 0.0–40.0)

## 2018-04-29 LAB — COMPREHENSIVE METABOLIC PANEL
ALT: 12 U/L (ref 0–35)
AST: 19 U/L (ref 0–37)
Albumin: 4.3 g/dL (ref 3.5–5.2)
Alkaline Phosphatase: 47 U/L (ref 39–117)
BUN: 12 mg/dL (ref 6–23)
CO2: 24 mEq/L (ref 19–32)
Calcium: 9.7 mg/dL (ref 8.4–10.5)
Chloride: 108 mEq/L (ref 96–112)
Creatinine, Ser: 0.8 mg/dL (ref 0.40–1.20)
GFR: 116.26 mL/min (ref >60.00)
Glucose, Bld: 110 mg/dL — ABNORMAL HIGH (ref 70–99)
Potassium: 4.1 mEq/L (ref 3.5–5.1)
Sodium: 139 mEq/L (ref 135–145)
Total Bilirubin: 0.4 mg/dL (ref 0.2–1.2)
Total Protein: 7.3 g/dL (ref 6.0–8.3)

## 2018-04-29 LAB — HIV ANTIBODY (ROUTINE TESTING W REFLEX): HIV 1&2 Ab, 4th Generation: NONREACTIVE

## 2018-04-29 LAB — SICKLE CELL SCREEN: Sickle Solubility Test - HGBRFX: NEGATIVE

## 2018-04-29 LAB — CBC
HCT: 41 % (ref 36.0–46.0)
Hemoglobin: 13.4 g/dL (ref 12.0–15.0)
MCHC: 32.7 g/dL (ref 30.0–36.0)
MCV: 83 fl (ref 78.0–100.0)
Platelets: 340 10*3/uL (ref 150.0–400.0)
RBC: 4.93 Mil/uL (ref 3.87–5.11)
RDW: 13.6 % (ref 11.5–15.5)
WBC: 6.6 10*3/uL (ref 4.0–10.5)

## 2018-04-29 LAB — RPR: RPR Ser Ql: NONREACTIVE

## 2018-05-01 ENCOUNTER — Ambulatory Visit (INDEPENDENT_AMBULATORY_CARE_PROVIDER_SITE_OTHER): Payer: Federal, State, Local not specified - PPO

## 2018-05-01 ENCOUNTER — Ambulatory Visit: Payer: Federal, State, Local not specified - PPO

## 2018-05-01 ENCOUNTER — Other Ambulatory Visit: Payer: Self-pay | Admitting: Family Medicine

## 2018-05-01 ENCOUNTER — Telehealth: Payer: Self-pay

## 2018-05-01 DIAGNOSIS — N898 Other specified noninflammatory disorders of vagina: Secondary | ICD-10-CM | POA: Diagnosis not present

## 2018-05-01 DIAGNOSIS — Z309 Encounter for contraceptive management, unspecified: Secondary | ICD-10-CM

## 2018-05-01 LAB — CYTOLOGY - PAP
Bacterial vaginitis: NEGATIVE
Candida vaginitis: NEGATIVE
Chlamydia: NEGATIVE
Diagnosis: UNDETERMINED — AB
HPV: NOT DETECTED
Neisseria Gonorrhea: POSITIVE — AB
Trichomonas: NEGATIVE

## 2018-05-01 LAB — CERVICOVAGINAL ANCILLARY ONLY: Herpes: NEGATIVE

## 2018-05-01 MED ORDER — CEFTRIAXONE SODIUM 250 MG IJ SOLR
250.0000 mg | Freq: Once | INTRAMUSCULAR | Status: AC
  Administered 2018-05-01: 250 mg via INTRAMUSCULAR

## 2018-05-01 MED ORDER — AZITHROMYCIN 500 MG PO TABS: 1000.0000 mg | ORAL_TABLET | Freq: Every day | ORAL | 0 refills | Status: DC

## 2018-05-01 NOTE — Telephone Encounter (Signed)
-----   Message from Dianne Dunalia M Aron, MD sent at 05/01/2018 12:24 PM EDT ----- Please call pt- she did test positive for chlamydia and needs to be treated with rocephin 250 mg IM x 1 and azithromycin 1 gram- one dose orally x 1.  I will send the azithromycin to her pharmacy.  Please schedule nurse visit for her to receive ceftriaxone.  Please advise her to inform her sexual partners.   Thank you. She also had some atypical cells on her pap (NOT CANCER) but we do want to repeat her pap in 1 year to make sure it has cleared.

## 2018-05-01 NOTE — Progress Notes (Signed)
Pt presented for IM injection of Ceftriaxone 250mg  per order from Dr Dayton MartesAron 8.29.19. IM injection was given in L ventrogluteal. Pt tolerated well with no S/S prior to check out.

## 2018-05-01 NOTE — Telephone Encounter (Signed)
Pt aware/Per Shelby Andrews ok for pt to be here for nurse visit at 4:30pm/she is on the schedule for 3:40pm/she is to receive a Rocephin 250mg  IM injection/she is also aware of the Azithromycin 1G 1 dose that was sent to pharmacy/also advised that partners need Tx as well/[facility]/thx dmf

## 2018-05-06 ENCOUNTER — Ambulatory Visit (INDEPENDENT_AMBULATORY_CARE_PROVIDER_SITE_OTHER): Payer: Federal, State, Local not specified - PPO

## 2018-05-06 DIAGNOSIS — Z309 Encounter for contraceptive management, unspecified: Secondary | ICD-10-CM

## 2018-05-06 MED ORDER — MEDROXYPROGESTERONE ACETATE 150 MG/ML IM SUSP
150.0000 mg | Freq: Once | INTRAMUSCULAR | Status: AC
  Administered 2018-05-06: 150 mg via INTRAMUSCULAR

## 2018-05-06 NOTE — Addendum Note (Signed)
Addended by: Reuben Likes on: 05/06/2018 08:47 AM   Modules accepted: Orders

## 2018-05-06 NOTE — Progress Notes (Signed)
Pt arrived for depo-provera injection 150mg /ml. IM injection given in R ventrogluteal. Pt tolerated well and no S/S were observed prior to leaving nurse visit. Pt instructed to make next appt between Nov. 19-Dec. 3

## 2018-05-12 ENCOUNTER — Telehealth: Payer: Self-pay | Admitting: Family Medicine

## 2018-05-12 NOTE — Telephone Encounter (Signed)
Patient came by today and dropped off [employer] forms to be filled out. Please call patient once forms are filled.

## 2018-07-14 DIAGNOSIS — K080 Exfoliation of teeth due to systemic causes: Secondary | ICD-10-CM | POA: Diagnosis not present

## 2018-07-16 DIAGNOSIS — K080 Exfoliation of teeth due to systemic causes: Secondary | ICD-10-CM | POA: Diagnosis not present

## 2018-07-17 DIAGNOSIS — K011 Impacted teeth: Secondary | ICD-10-CM | POA: Diagnosis not present

## 2018-07-17 DIAGNOSIS — K006 Disturbances in tooth eruption: Secondary | ICD-10-CM | POA: Diagnosis not present

## 2018-07-21 DIAGNOSIS — K011 Impacted teeth: Secondary | ICD-10-CM | POA: Diagnosis not present

## 2018-07-21 DIAGNOSIS — K006 Disturbances in tooth eruption: Secondary | ICD-10-CM | POA: Diagnosis not present

## 2018-07-29 ENCOUNTER — Ambulatory Visit: Payer: Federal, State, Local not specified - PPO

## 2018-07-29 ENCOUNTER — Encounter: Payer: Self-pay | Admitting: Family Medicine

## 2018-07-29 ENCOUNTER — Other Ambulatory Visit (HOSPITAL_COMMUNITY)
Admission: RE | Admit: 2018-07-29 | Discharge: 2018-07-29 | Disposition: A | Discharge status: Home / Self Care | Payer: Federal, State, Local not specified - PPO | Source: Ambulatory Visit | Attending: Family Medicine | Admitting: Family Medicine

## 2018-07-29 ENCOUNTER — Ambulatory Visit: Payer: Federal, State, Local not specified - PPO | Admitting: Family Medicine

## 2018-07-29 VITALS — BP 112/68 | HR 82 | Temp 98.2°F | Ht 69.25 in | Wt 156.0 lb

## 2018-07-29 DIAGNOSIS — Z113 Encounter for screening for infections with a predominantly sexual mode of transmission: Secondary | ICD-10-CM | POA: Insufficient documentation

## 2018-07-29 DIAGNOSIS — Z8619 Personal history of other infectious and parasitic diseases: Secondary | ICD-10-CM | POA: Diagnosis not present

## 2018-07-29 DIAGNOSIS — Z309 Encounter for contraceptive management, unspecified: Secondary | ICD-10-CM | POA: Diagnosis not present

## 2018-07-29 MED ORDER — MEDROXYPROGESTERONE ACETATE 150 MG/ML IM SUSP
150.0000 mg | Freq: Once | INTRAMUSCULAR | Status: AC
  Administered 2018-07-29: 150 mg via INTRAMUSCULAR

## 2018-07-29 NOTE — Assessment & Plan Note (Signed)
Discussed sexual activity, pregnancy risk, and STD risk.   Orders Placed This Encounter  Procedures  . HIV antibody (with reflex)  . RPR    

## 2018-07-29 NOTE — Progress Notes (Signed)
Subjective:   Patient ID: Shelby Andrews, female    DOB: 09/06/1995, 22 y.o.   MRN: [id number]  Shelby Andrews is a pleasant 22 y.o. year old female who presents to clinic today with Follow-up (Wants to discuss recheck for STD-denies symptoms. )  on 07/29/2018  HPI:   Diagnosed with chlamydia on 05/01/18- we treated her appropriately with Rocephin 250mg  IM injection, Azithromycin 1G 1 dose.  She was  advised that partners need Tx as well and [facility] .  She a new partner now.  Using condoms and on depo proveral for pregnancy prevention.  Not having any vaginal discharge, sores, or other complaints.  Pap smear is UTD.  Current Outpatient Medications on File Prior to Visit  Medication Sig Dispense Refill  . medroxyPROGESTERone (DEPO-PROVERA) 150 MG/ML injection Inject 150 mg into the muscle every 3 (three) months.     No current facility-administered medications on file prior to visit.     No Known Allergies  Past Medical History:  Diagnosis Date  . ADHD (attention deficit hyperactivity disorder)    Formall evaluated by psychological services    No past surgical history on file.  Family History  Problem Relation Age of Onset  . Diabetes Maternal Grandmother   . Hypertension Maternal Grandmother   . Diabetes Maternal Grandfather   . Hypertension Maternal Grandfather     Social History   Socioeconomic History  . Marital status: Single    Spouse name: Not on file  . Number of children: Not on file  . Years of education: Not on file  . Highest education level: Not on file  Occupational History  . Not on file  Social Needs  . Financial resource strain: Not on file  . Food insecurity:    Worry: Not on file    Inability: Not on file  . Transportation needs:    Medical: Not on file    Non-medical: Not on file  Tobacco Use  . Smoking status: Never Smoker  . Smokeless tobacco: Never Used  Substance and Sexual Activity  . Alcohol  use: No  . Drug use: No  . Sexual activity: Never  Lifestyle  . Physical activity:    Days per week: Not on file    Minutes per session: Not on file  . Stress: Not on file  Relationships  . Social connections:    Talks on phone: Not on file    Gets together: Not on file    Attends religious service: Not on file    Active member of club or organization: Not on file    Attends meetings of clubs or organizations: Not on file    Relationship status: Not on file  . Intimate partner violence:    Fear of current or ex partner: Not on file    Emotionally abused: Not on file    Physically abused: Not on file    Forced sexual activity: Not on file  Other Topics Concern  . 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   Runs track   The PMH, PSH, Social History, Family History, Medications, and allergies [facility], and have been updated if relevant.   Review of Systems  Genitourinary: Negative for decreased urine volume, difficulty urinating, dyspareunia, enuresis, flank pain, frequency, hematuria, menstrual problem, pelvic pain, urgency, vaginal  bleeding, vaginal discharge and vaginal pain.  All other systems reviewed and are negative.      Objective:    BP 112/68   Pulse 82   Temp 98.2 F (36.8 C) (Oral)   Ht 5' 9.25" (1.759 m)   Wt 156 lb (70.8 kg)   SpO2 98%   BMI 22.87 kg/m    Physical Exam    General:  Well-developed,well-nourished,in no acute distress; alert,appropriate and cooperative throughout examination Head:  normocephalic and atraumatic.   Eyes:  vision grossly intact, PERRL Ears:  R ear normal and L ear normal externally, TMs clear bilaterally Nose:  no external deformity.   Mouth:  good dentition.   Neck:  No deformities, masses, or tenderness noted. Lungs:  Normal respiratory effort, chest expands symmetrically. Lungs are clear to auscultation, no  crackles or wheezes. Heart:  Normal rate and regular rhythm. S1 and S2 normal without gallop, murmur, click, rub or other extra sounds. Abdomen:  Bowel sounds positive,abdomen soft and non-tender without masses, organomegaly or hernias noted. Rectal:  no external abnormalities.   Genitalia:  Pelvic Exam:        External: normal female genitalia without lesions or masses        Vagina: normal without lesions or masses        Cervix: normal without lesions or masses        Adnexa: normal bimanual exam without masses or fullness        Uterus: normal by palpation Msk:  No deformity or scoliosis noted of thoracic or lumbar spine.   Extremities:  No clubbing, cyanosis, edema, or deformity noted with normal full range of motion of all joints.   Neurologic:  alert & oriented X3 and gait normal.   Skin:  Intact without suspicious lesions or rashes Psych:  Cognition and judgment appear intact. Alert and cooperative with normal attention span and concentration. No apparent delusions, illusions, hallucinations      Assessment & Plan:   Screening for STD (sexually transmitted disease) - Plan: Cervicovaginal ancillary only( [place]), HIV antibody (with reflex), RPR  History of chlamydia - Plan: Cervicovaginal ancillary only( [place]), HIV antibody (with reflex), RPR No follow-ups on file.

## 2018-07-29 NOTE — Assessment & Plan Note (Signed)
IM depo given today. 

## 2018-07-29 NOTE — Assessment & Plan Note (Signed)
Orders Placed This Encounter  Procedures  . HIV antibody (with reflex)  . RPR   S/p appropriate tx in 04/2018. Test of cure and repeat screening done today.

## 2018-07-29 NOTE — Patient Instructions (Addendum)
Great to see you. I will call you with your lab results from today and you can view them online.   Happy Holidays! 

## 2018-07-30 ENCOUNTER — Ambulatory Visit (INDEPENDENT_AMBULATORY_CARE_PROVIDER_SITE_OTHER): Payer: Federal, State, Local not specified - PPO | Admitting: *Deleted

## 2018-07-30 ENCOUNTER — Other Ambulatory Visit: Payer: Self-pay | Admitting: Family Medicine

## 2018-07-30 DIAGNOSIS — A749 Chlamydial infection, unspecified: Secondary | ICD-10-CM | POA: Diagnosis not present

## 2018-07-30 LAB — HIV ANTIBODY (ROUTINE TESTING W REFLEX): HIV 1&2 Ab, 4th Generation: NONREACTIVE

## 2018-07-30 LAB — CERVICOVAGINAL ANCILLARY ONLY
Chlamydia: POSITIVE — AB
Neisseria Gonorrhea: NEGATIVE
Trichomonas: NEGATIVE

## 2018-07-30 LAB — RPR: RPR Ser Ql: NONREACTIVE

## 2018-07-30 MED ORDER — CEFTRIAXONE SODIUM 250 MG IJ SOLR
250.0000 mg | Freq: Once | INTRAMUSCULAR | Status: AC
  Administered 2018-07-30: 250 mg via INTRAMUSCULAR

## 2018-07-30 MED ORDER — AZITHROMYCIN 500 MG PO TABS: 1000.0000 mg | ORAL_TABLET | Freq: Every day | ORAL | 0 refills | Status: DC

## 2018-07-30 NOTE — Progress Notes (Signed)
Patient was given IM injection Rocephin-patient tolerated well.

## 2018-08-05 LAB — CERVICOVAGINAL ANCILLARY ONLY: Herpes: NEGATIVE

## 2018-09-30 DIAGNOSIS — F411 Generalized anxiety disorder: Secondary | ICD-10-CM | POA: Diagnosis not present

## 2018-10-09 ENCOUNTER — Ambulatory Visit: Payer: Federal, State, Local not specified - PPO | Admitting: Family Medicine

## 2018-10-09 ENCOUNTER — Other Ambulatory Visit (HOSPITAL_COMMUNITY)
Admission: RE | Admit: 2018-10-09 | Discharge: 2018-10-09 | Disposition: A | Discharge status: Home / Self Care | Payer: Federal, State, Local not specified - PPO | Source: Ambulatory Visit | Attending: Family Medicine | Admitting: Family Medicine

## 2018-10-09 ENCOUNTER — Encounter: Payer: Self-pay | Admitting: Family Medicine

## 2018-10-09 VITALS — BP 130/82 | HR 91 | Temp 97.9°F | Ht 69.25 in | Wt 159.4 lb

## 2018-10-09 DIAGNOSIS — N898 Other specified noninflammatory disorders of vagina: Secondary | ICD-10-CM | POA: Diagnosis not present

## 2018-10-09 MED ORDER — METRONIDAZOLE 500 MG PO TABS: 500.0000 mg | ORAL_TABLET | Freq: Two times a day (BID) | ORAL | 0 refills | Status: AC

## 2018-10-09 NOTE — Progress Notes (Signed)
Shelby Andrews is a 23 y.o. female  Chief Complaint  Patient presents with  . Vaginitis    itching, discharge-- brown/ 1 mo     HPI: Shelby Andrews is a 23 y.o. female complains of 1 mo h/o vaginal discharge and itching. She describes discharge as brown. No fever, chills. No abnormal bleeding. No n/v. No back or abdominal pain.  Pt is sexually active.   Past Medical History:  Diagnosis Date  . ADHD (attention deficit hyperactivity disorder)    Formall evaluated by psychological services    No past surgical history on file.  Social History   Socioeconomic History  . Marital status: Single    Spouse name: Not on file  . Number of children: Not on file  . Years of education: Not on file  . Highest education level: Not on file  Occupational History  . Not on file  Social Needs  . Financial resource strain: Not on file  . Food insecurity:    Worry: Not on file    Inability: Not on file  . Transportation needs:    Medical: Not on file    Non-medical: Not on file  Tobacco Use  . Smoking status: Never Smoker  . Smokeless tobacco: Never Used  Substance and Sexual Activity  . Alcohol use: No  . Drug use: No  . Sexual activity: Never  Lifestyle  . Physical activity:    Days per week: Not on file    Minutes per session: Not on file  . Stress: Not on file  Relationships  . Social connections:    Talks on phone: Not on file    Gets together: Not on file    Attends religious service: Not on file    Active member of club or organization: Not on file    Attends meetings of clubs or organizations: Not on file    Relationship status: Not on file  . Intimate partner violence:    Fear of current or ex partner: Not on file    Emotionally abused: Not on file    Physically abused: Not on file    Forced sexual activity: Not on file  Other Topics Concern  . 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   Runs track    Family History  Problem Relation Age of Onset  . Diabetes Maternal Grandmother   . Hypertension Maternal Grandmother   . Diabetes Maternal Grandfather   . Hypertension Maternal Grandfather      Immunization History  Administered Date(s) Administered  . DTaP 05/11/1997, 07/12/1997, 09/13/1997, 07/07/1998, 05/27/2001  . HPV 9-valent 03/10/2015, 06/01/2015  . HPV Quadrivalent 09/27/2009, 03/10/2015, 06/01/2015  . Hepatitis A 11/18/2007, 09/27/2009  . Hepatitis B 11-15-1996, 03/31/1997, 12/07/1997  . HiB (PRP-OMP) 05/11/1997, 07/12/1997, 07/07/1998  . Hpv 10/03/2010  . IPV 05/11/1997, 07/12/1997, 03/21/1998, 05/27/2001  . Influenza, Seasonal, Injecte, Preservative Fre 10/03/2012  . Influenza,inj,Quad PF,6+ Mos 09/10/2017  . Influenza-Unspecified 09/27/2009  . MMR 03/21/1998, 05/27/2001  . Meningococcal Conjugate 09/27/2009, 03/10/2015  . Tdap 11/18/2007, 04/28/2018  . Varicella 03/21/1998, 11/18/2007    Outpatient Encounter Medications as of 10/09/2018  Medication Sig  . azithromycin (ZITHROMAX) 500 MG tablet Take 2 tablets (1,000 mg total) by mouth daily. (Patient not taking: Reported on 10/09/2018)  . medroxyPROGESTERone (DEPO-PROVERA) 150 MG/ML injection Inject 150 mg into the muscle every 3 (three) months.  No facility-administered encounter medications on file as of 10/09/2018.      ROS: Gen: no fever, chills  Skin: no rash, itching Resp: no cough, wheeze,SOB CV: no CP, palpitations, LE edema,  GI: no heartburn, n/v/d/c, abd pain GU: no dysuria, urgency, frequency, hematuria; as above in HPI MSK: no joint pain, myalgias, back pain  No Known Allergies  BP 130/82   Pulse 91   Temp 97.9 F (36.6 C) (Oral)   Ht 5' 9.25" (1.759 m)   Wt 159 lb 6.4 oz (72.3 kg)   SpO2 98%   BMI 23.37 kg/m   Physical Exam  Constitutional: She appears well-developed and well-nourished. No distress.  Abdominal: Soft. Bowel sounds are  normal. She exhibits no distension and no mass. There is no abdominal tenderness.  Genitourinary: There is no rash, tenderness or lesion on the right labia. There is no rash, tenderness or lesion on the left labia. Cervix exhibits discharge (moderate amount of thin grey/brown discharge). Cervix exhibits no motion tenderness and no friability.    Vaginal discharge present.      A/P:  1. Vaginal discharge 2. Vaginal itching - Cervicovaginal ancillary only( [place]) - will contact pt once result available but will initiate treatment for BV at this time Rx: - metroNIDAZOLE (FLAGYL) 500 MG tablet; Take 1 tablet (500 mg total) by mouth 2 (two) times daily for 7 days.  Dispense: 14 tablet; Refill: 0 - f/u if symptoms worsen or do not improve in 7-10 days Discussed plan and reviewed medications with patient, including risks, benefits, and potential side effects. Pt expressed understand. All questions answered.

## 2018-10-12 LAB — CERVICOVAGINAL ANCILLARY ONLY
Bacterial vaginitis: POSITIVE — AB
Candida vaginitis: POSITIVE — AB
Chlamydia: NEGATIVE
Neisseria Gonorrhea: NEGATIVE
Trichomonas: NEGATIVE

## 2018-10-14 ENCOUNTER — Telehealth: Payer: Self-pay | Admitting: Family Medicine

## 2018-10-14 DIAGNOSIS — F411 Generalized anxiety disorder: Secondary | ICD-10-CM | POA: Diagnosis not present

## 2018-10-14 NOTE — Telephone Encounter (Signed)
Called pt she is aware that I haven't received her lab results back she's aware Dr. Salena Saner is out of the office on Monday's and Tuesdays and that as soon as I get her results ill give her a call.

## 2018-10-14 NOTE — Telephone Encounter (Signed)
Copied from CRM [id number]. Topic: Quick Communication - See Telephone Encounter >> Oct 14, 2018  8:56 AM Angela Nevin wrote: CRM for notification. See Telephone encounter for: 10/14/18.  Patient calling to check status of pap test results from 10/09/2018

## 2018-10-14 NOTE — Telephone Encounter (Signed)
Patient is calling for results- do not see permission to give- please review for patient notification

## 2018-10-15 ENCOUNTER — Other Ambulatory Visit: Payer: Self-pay | Admitting: Family Medicine

## 2018-10-15 ENCOUNTER — Encounter: Payer: Self-pay | Admitting: Family Medicine

## 2018-10-15 MED ORDER — FLUCONAZOLE 150 MG PO TABS: ORAL_TABLET | ORAL | 0 refills | Status: DC

## 2018-10-28 DIAGNOSIS — F411 Generalized anxiety disorder: Secondary | ICD-10-CM | POA: Diagnosis not present

## 2018-10-30 ENCOUNTER — Ambulatory Visit: Payer: Federal, State, Local not specified - PPO

## 2018-10-30 ENCOUNTER — Encounter: Payer: Self-pay | Admitting: Family Medicine

## 2018-10-30 VITALS — BP 110/60 | HR 80 | Temp 98.3°F | Ht 69.0 in | Wt 162.4 lb

## 2018-10-30 DIAGNOSIS — Z308 Encounter for other contraceptive management: Secondary | ICD-10-CM

## 2018-10-30 LAB — POCT URINE PREGNANCY: Preg Test, Ur: NEGATIVE

## 2018-10-30 MED ORDER — MEDROXYPROGESTERONE ACETATE 150 MG/ML IM SUSP
150.0000 mg | Freq: Once | INTRAMUSCULAR | Status: AC
  Administered 2018-10-30: 150 mg via INTRAMUSCULAR

## 2018-10-30 NOTE — Patient Instructions (Signed)
Please schedule a Nurse Visit for your next Depo-Provera injection between 5.15.20 - 5.29.20 :)

## 2018-10-30 NOTE — Progress Notes (Signed)
   Subjective:   Patient ID: Manuela Schwartz, female    DOB: 12/05/1995, 23 y.o.   MRN: [id number]  LUTIE SAWADA is a pleasant 23 y.o. year old female who presents to clinic today with Follow-up  on 10/30/2018  HPI: After obtaining consent, resulting negative HCG, and per orders of Dr. Dayton Martes, injection of Depo-Provera 153m/mL given in LUOQ by D Jule Ser. Patient instructed to remain in clinic for 20 minutes afterwards, and to report any adverse reaction to me immediately.  Here for IM Depo- nurse visit.     I reviewed CMA's  note, was available for consultation, and agree with documentation and plan.  Current Outpatient Medications on File Prior to Visit  Medication Sig Dispense Refill  . medroxyPROGESTERone (DEPO-PROVERA) 150 MG/ML injection Inject 150 mg into the muscle every 3 (three) months.     No current facility-administered medications on file prior to visit.     No Known Allergies  Past Medical History:  Diagnosis Date  . ADHD (attention deficit hyperactivity disorder)    Formall evaluated by psychological services    No past surgical history on file.  Family History  Problem Relation Age of Onset  . Diabetes Maternal Grandmother   . Hypertension Maternal Grandmother   . Diabetes Maternal Grandfather   . Hypertension Maternal Grandfather     Social History   Socioeconomic History  . Marital status: Single    Spouse name: Not on file  . Number of children: Not on file  . Years of education: Not on file  . Highest education level: Not on file  Occupational History  . Not on file  Social Needs  . Financial resource strain: Not on file  . Food insecurity:    Worry: Not on file    Inability: Not on file  . Transportation needs:    Medical: Not on file    Non-medical: Not on file  Tobacco Use  . Smoking status: Never Smoker  . Smokeless tobacco: Never Used  Substance and Sexual Activity  . Alcohol use: No  . Drug use: No  . Sexual activity:  Never  Lifestyle  . Physical activity:    Days per week: Not on file    Minutes per session: Not on file  . Stress: Not on file  Relationships  . Social connections:    Talks on phone: Not on file    Gets together: Not on file    Attends religious service: Not on file    Active member of club or organization: Not on file    Attends meetings of clubs or organizations: Not on file    Relationship status: Not on file  . Intimate partner violence:    Fear of current or ex partner: Not on file    Emotionally abused: Not on file    Physically abused: Not on file    Forced sexual activity: Not on file  Other Topics Concern  . 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   Runs track   The PMH, PSH, Social History, Family History, Medications, and allergies have been reviewed in [facility], and have been updated if relevant.        Assessment & Plan:   Encounter for other contraceptive management - Plan: POCT urine pregnancy, medroxyPROGESTERone (DEPO-PROVERA) injection 150 mg No follow-ups on file.

## 2018-11-11 DIAGNOSIS — F411 Generalized anxiety disorder: Secondary | ICD-10-CM | POA: Diagnosis not present

## 2018-12-02 DIAGNOSIS — F411 Generalized anxiety disorder: Secondary | ICD-10-CM | POA: Diagnosis not present

## 2018-12-16 NOTE — Progress Notes (Signed)
I reviewed health advisor's note, was available for consultation, and agree with documentation and plan.  

## 2018-12-18 DIAGNOSIS — F411 Generalized anxiety disorder: Secondary | ICD-10-CM | POA: Diagnosis not present

## 2019-01-08 DIAGNOSIS — F411 Generalized anxiety disorder: Secondary | ICD-10-CM | POA: Diagnosis not present

## 2019-01-22 ENCOUNTER — Ambulatory Visit: Payer: Federal, State, Local not specified - PPO

## 2019-01-27 ENCOUNTER — Ambulatory Visit (INDEPENDENT_AMBULATORY_CARE_PROVIDER_SITE_OTHER): Payer: Federal, State, Local not specified - PPO

## 2019-01-27 DIAGNOSIS — Z308 Encounter for other contraceptive management: Secondary | ICD-10-CM | POA: Diagnosis not present

## 2019-01-27 MED ORDER — MEDROXYPROGESTERONE ACETATE 150 MG/ML IM SUSP
150.0000 mg | Freq: Once | INTRAMUSCULAR | Status: AC
  Administered 2019-01-27: 10:00:00 150 mg via INTRAMUSCULAR

## 2019-01-27 NOTE — Progress Notes (Signed)
Medical screening examination/treatment/procedure(s) were performed by the LPN. As primary care provider I was immediately available for consulation/collaboration. I agree with above documentation. Deangelo Berns, AGNP-C 

## 2019-01-27 NOTE — Progress Notes (Signed)
Pt presents in office today for Depo provera injection. IM injection was given in the left ventrogluteal. Pt tolerated well. No signs or symptoms of reaction prior to leaving office. Pt made next depo provera injection for 04/21/2019 at 940a.

## 2019-02-05 DIAGNOSIS — F411 Generalized anxiety disorder: Secondary | ICD-10-CM | POA: Diagnosis not present

## 2019-03-10 ENCOUNTER — Ambulatory Visit (INDEPENDENT_AMBULATORY_CARE_PROVIDER_SITE_OTHER): Payer: Federal, State, Local not specified - PPO | Admitting: Family Medicine

## 2019-03-10 ENCOUNTER — Encounter: Payer: Self-pay | Admitting: Family Medicine

## 2019-03-10 DIAGNOSIS — A059 Bacterial foodborne intoxication, unspecified: Secondary | ICD-10-CM | POA: Diagnosis not present

## 2019-03-10 NOTE — Patient Instructions (Signed)
Food Poisoning Food poisoning is caused by eating or drinking foods or drinks that are spoiled. In most cases, food poisoning is mild and lasts 1-2 days. However, some cases can be serious, especially for people who have weak body defense systems (immune systems), older people, children and babies, and pregnant women. What are the causes? This condition is caused by eating foods that contain viruses, bacteria, parasites, or mold. This can happen by:  Not washing your hands well enough or often enough.  Not storing food properly.  Preparing, serving, and storing food on surfaces that are not clean.  Cooking or eating with utensils that are not clean. Viruses, bacteria, or parasites can hurt the intestine. This often causes very bad watery poop (diarrhea). The most common causes of this condition are:  Viruses, such as: ? Norovirus. ? Rotavirus.  Bacteria, such as: ? Salmonella. ? Listeria. ? E. coli (Escherichia coli).  Parasites, such as: ? Giardia. ? Toxoplasma gondii. What are the signs or symptoms?  Feeling sick to your stomach (nauseous).  Throwing up (vomiting).  Cramping.  Watery poop.  Fever.  Chills.  Muscle aches.  Not having enough water in the body (dehydration). How is this treated?  Making sure that you get enough to drink.  Taking medicines.  In very bad cases, you may need: ? To be treated in a hospital. ? To get fluids through an IV tube. Follow these instructions at home: Eating and drinking   Drink enough fluids to keep your pee (urine) pale yellow.  You may need to drink small amounts of clear liquids often.  Avoid: ? Milk. ? Caffeine. ? Alcohol.  Ask your doctor how you should get enough fluid in your body.  Eat small meals often instead of eating large meals. Medicines  Take over-the-counter and prescription medicines only as told by your doctor.  Ask your doctor if you should keep taking any of your regular medicines.   If you were prescribed an antibiotic medicine, take it as told by your doctor. Do not stop taking the antibiotic even if you start to feel better. General instructions   Wash your hands well: ? Before you prepare food. ? After you go to the bathroom (use the toilet).  Make sure that the people who live with you also wash their hands often.  Rest at home until you feel better.  Clean surfaces that you touch with a product that contains chlorine bleach.  Keep all follow-up visits as told by your doctor. This is important. How is this prevented?  Before and after you handle raw foods: ? Wash your hands. ? Wash the surfaces used to prepare the food. ? Wash the utensils.  Do not prepare or store raw meat in the same place you prepare or store fresh fruits and vegetables.  Keep refrigerated foods colder than 40F (5C).  Serve hot foods right away or keep them heated above 140F (60C).  Store dry foods in cool, dry spaces. Keep them away from too much heat or moisture.  Throw out any foods that: ? Do not smell right. ? Are in cans that are bulging.  Follow approved canning procedures.  Heat canned foods fully before you taste them.  Drink bottled or germ-free (sterile) water when you travel. Get help right away if:  You have trouble: ? Breathing. ? Swallowing. ? Talking. ? Moving.  You have blurry vision.  You cannot eat or drink without throwing up.  You pass out (faint).  The  whites of your eyes turn yellow (jaundice).  You continue to throw up or have watery poop.  You start to have pain in your belly, your belly pain gets worse, or your belly pain stays in one small area.  You have a fever.  You have blood or mucus in your poop (stools), or your poop looks dark black and tarry.  You have signs of not having enough water in your body, such as: ? Dark pee, very little pee, or no pee. ? Cracked lips. ? Not making tears while crying. ? Dry mouth. ?  Sunken eyes. ? Being sleepy. ? Feeling weak. ? Being dizzy. These symptoms may be an emergency. Do not wait to see if the symptoms will go away. Get medical help right away. Call your local emergency services (911 in the [place] Do not drive yourself to the hospital. Summary  Food poisoning is an illness that is caused by eating or drinking spoiled foods or drinks.  Symptoms may include feeling sick to your stomach, throwing up, and having watery poop.  In mild cases, the illness will get better on its own in 1-2 days.  In very bad cases, you may need to stay at the hospital. This information is not intended to replace advice given to you by your health care provider. Make sure you discuss any questions you have with your health care provider.

## 2019-03-10 NOTE — Progress Notes (Signed)
Virtual Visit via Video   Due to the COVID-19 pandemic, this visit was completed with telemedicine (audio/video) technology to reduce patient and provider exposure as well as to preserve personal protective equipment.   I connected with Manuela SchwartzSequoia R Nisley by a video enabled telemedicine application and verified that I am speaking with the correct person using two identifiers. Location patient: Home Location provider: [facility], Office Persons participating in the virtual visit: Tish FredericksonSequoia R Zmuda, Sheri Prows, MD   I discussed the limitations of evaluation and management by telemedicine and the availability of in person appointments. The patient expressed understanding and agreed to proceed.  Care Team   Patient Care Team: Dianne DunAron, Anquan Azzarello M, MD as PCP - General (Family Medicine) Leticia Pennarump, Bobi A, NP as Referring Physician (Nurse Practitioner)  Subjective:   HPI:  Emesis and lightheadedness-  Started vomiting two hours after eating eggs.  She thinks the eggs were bad.  She has only vomited once.  No longer nauseated.  Had some diarrhea but that has resolved.  Feels okay now but does feel she should stay out of work as she doesn't know if her symptoms will change.    She does not want any rx at this time for nausea.  Review of Systems  Constitutional: Negative for fever.  Gastrointestinal: Positive for abdominal pain, diarrhea, nausea and vomiting. Negative for blood in stool, constipation and melena.  All other systems reviewed and are negative.    Patient Active Problem List   Diagnosis Date Noted  . History of chlamydia 07/29/2018  . Screening for STD (sexually transmitted disease) 04/28/2018  . Menorrhagia 11/30/2015  . Contraception management 03/10/2015    Social History   Tobacco Use  . Smoking status: Never Smoker  . Smokeless tobacco: Never Used  Substance Use Topics  . Alcohol use: No    Current Outpatient Medications:  .  medroxyPROGESTERone (DEPO-PROVERA) 150  MG/ML injection, Inject 150 mg into the muscle every 3 (three) months., Disp: , Rfl:   No Known Allergies  Objective:  There were no vitals taken for this visit.  VITALS: Per patient if applicable, see vitals. GENERAL: Alert, appears well and in no acute distress. HEENT: Atraumatic, conjunctiva clear, no obvious abnormalities on inspection of external nose and ears. NECK: Normal movements of the head and neck. CARDIOPULMONARY: No increased WOB. Speaking in clear sentences. I:E ratio WNL.  MS: Moves all visible extremities without noticeable abnormality. PSYCH: Pleasant and cooperative, well-groomed. Speech normal rate and rhythm. Affect is appropriate. Insight and judgement are appropriate. Attention is focused, linear, and appropriate.  NEURO: CN grossly intact. Oriented as arrived to appointment on time with no prompting. Moves both UE equally.  SKIN: No obvious lesions, wounds, erythema, or cyanosis noted on face or hands.  [facility] 10/30/2018 04/28/2018 04/11/2017  Decreased Interest 0 0 0  Down, Depressed, Hopeless 0 0 0  PHQ - 2 Score 0 0 0    Assessment and Plan:   There are no diagnoses linked to this encounter.  Marland Kitchen. COVID-19 Education: The signs and symptoms of COVID-19 were discussed with the patient and how to seek care for testing if needed. The importance of social distancing was discussed today. . Reviewed expectations re: course of current medical issues. . Discussed self-management of symptoms. . Outlined signs and symptoms indicating need for more acute intervention. . Patient verbalized understanding and all questions were answered. Marland Kitchen. Health Maintenance issues including appropriate healthy diet, exercise, and smoking avoidance were discussed with  patient. . See orders for this visit as documented in the electronic medical record.  Arnette Norris, MD  Records requested if needed. Time spent: 15 minutes, of which >50% was spent in obtaining information  about her symptoms, reviewing her previous labs, evaluations, and treatments, counseling her about her condition (please see the discussed topics above), and developing a plan to further investigate it; she had a number of questions which I addressed.

## 2019-03-10 NOTE — Assessment & Plan Note (Signed)
Likely food poisoning based on presentation. Advised supportive care as stated in my chart message. Work note written and sent through [employer]. Call or send my chart message prn if these symptoms worsen or fail to improve as anticipated.  The patient indicates understanding of these issues and agrees with the plan.

## 2019-03-13 IMAGING — US US PELVIS COMPLETE TRANSABD/TRANSVAG
1 series · 14 of 25 positions shown · non-contrast
Comparison: None

CLINICAL DATA: Left adnexal pain

EXAM:
TRANSABDOMINAL AND TRANSVAGINAL ULTRASOUND OF PELVIS
TECHNIQUE: Both transabdominal and transvaginal ultrasound examinations of the
pelvis were performed. Transabdominal technique was performed for
global imaging of the pelvis including uterus, ovaries, adnexal
regions, and pelvic cul-de-sac. It was necessary to proceed with
endovaginal exam following the transabdominal exam to visualize the
uterus endometrium and ovaries.

[Series 1: us pelvis complete transabd/transvag · 0.15mm/px · 14 of 53 slices shown]
[im 1/53]
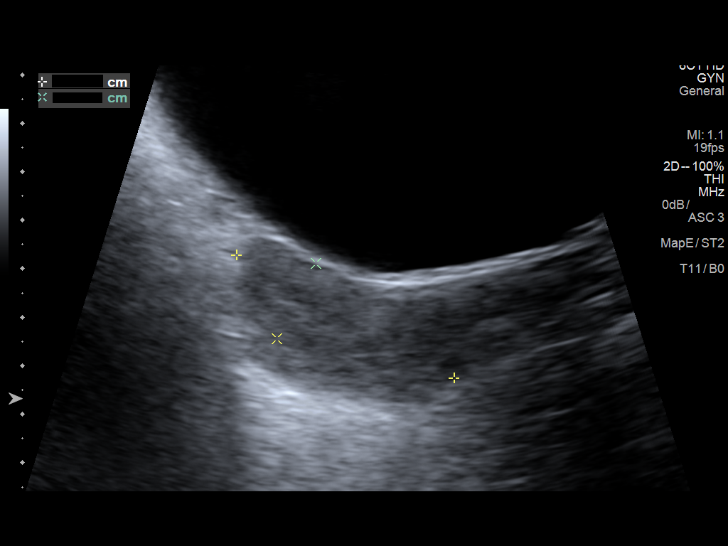
[im 5/53]
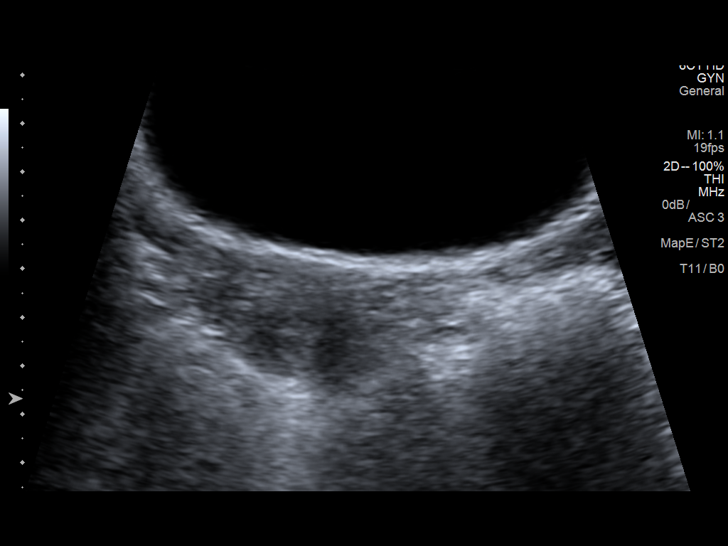
[im 9/53]
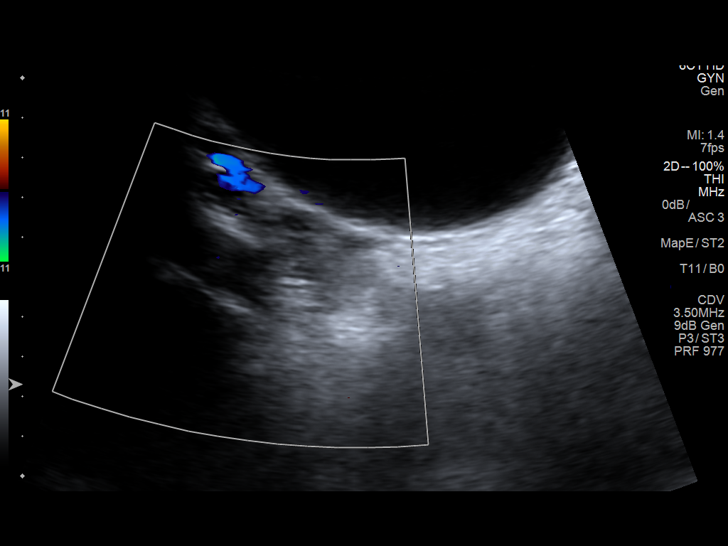
[im 14/53]
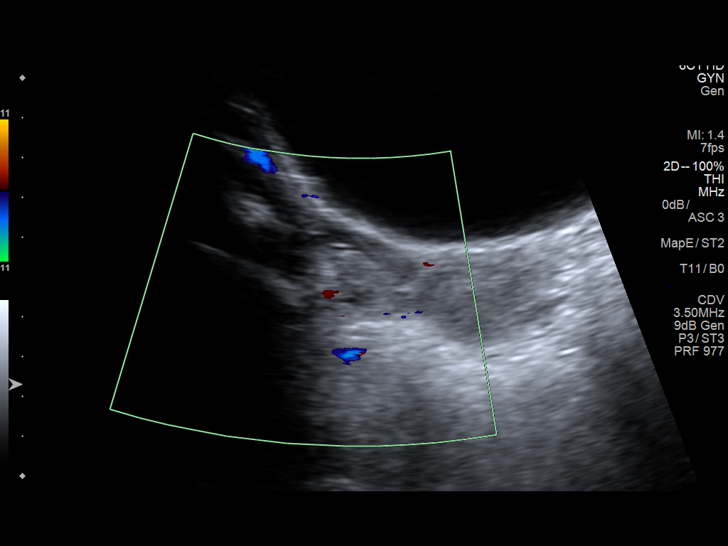
[im 18/53]
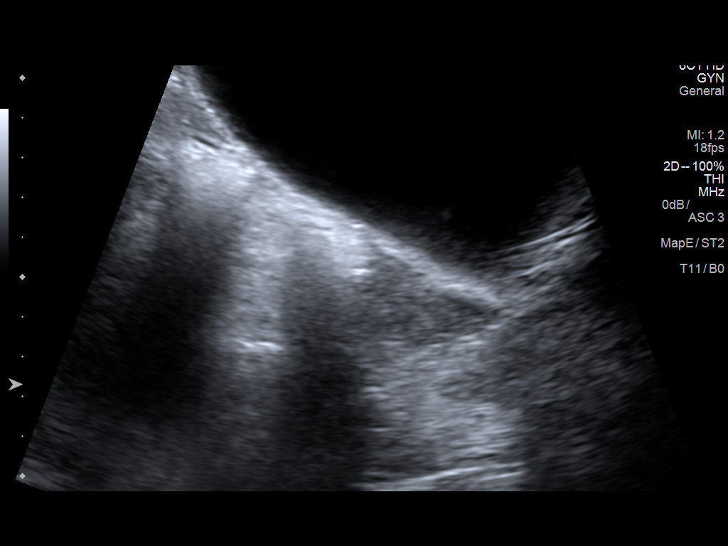
[im 20/53]
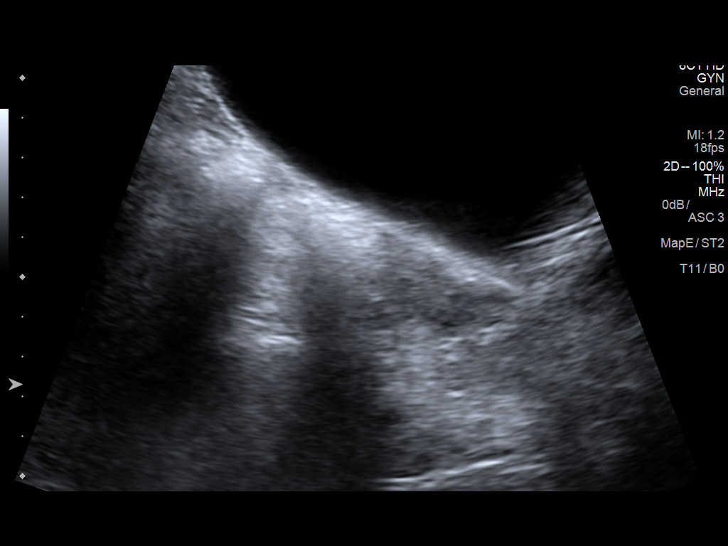
[im 24/53]
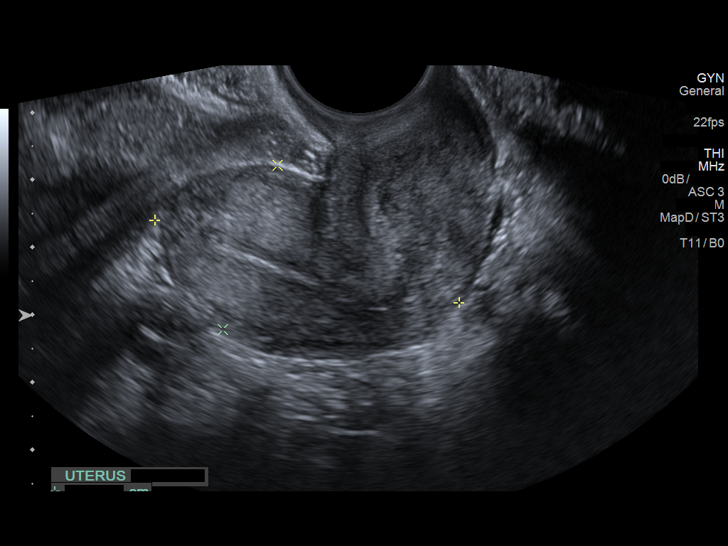
[im 29/53]
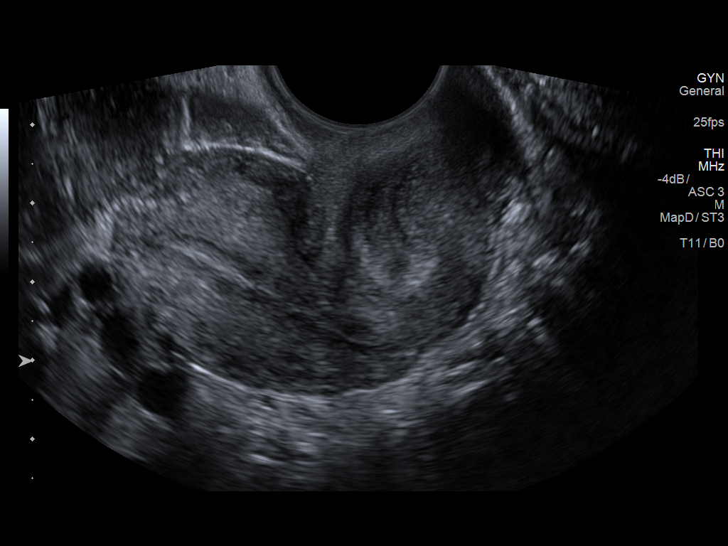
[im 33/53]
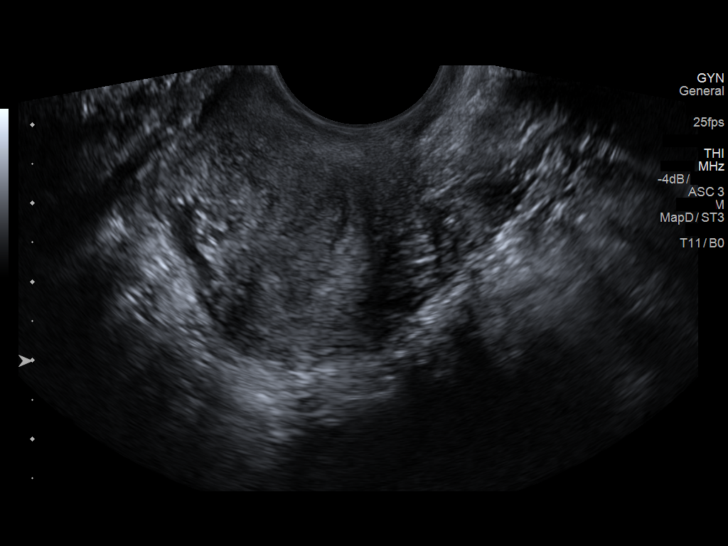
[im 35/53]
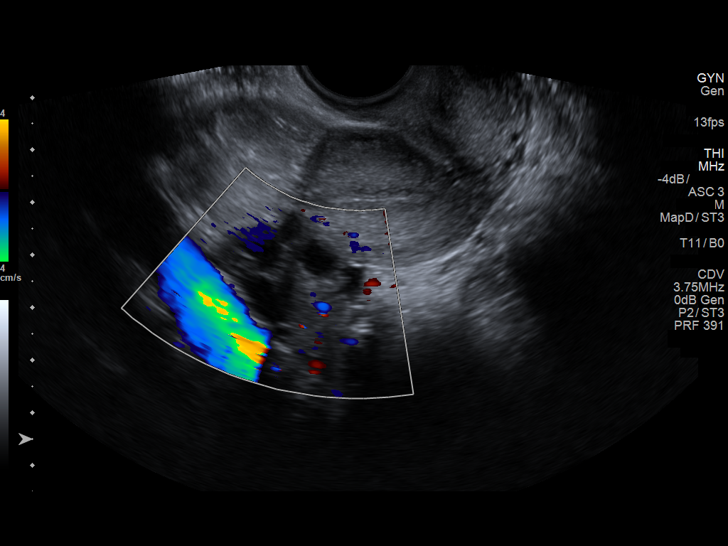
[im 40/53]
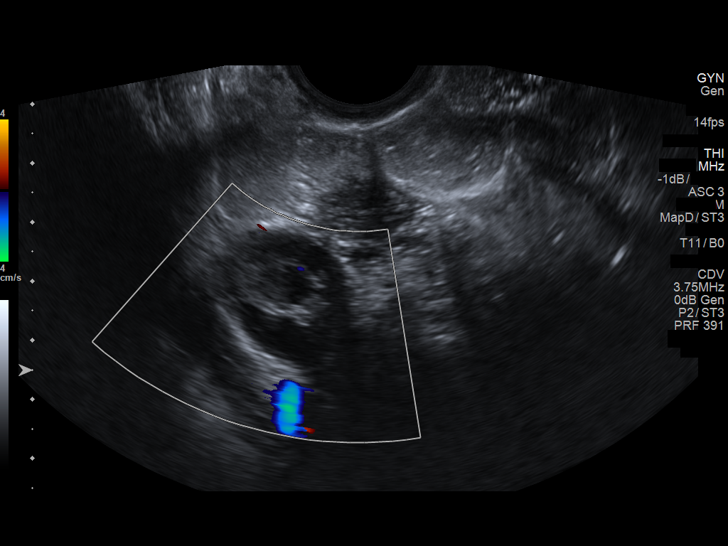
[im 44/53]
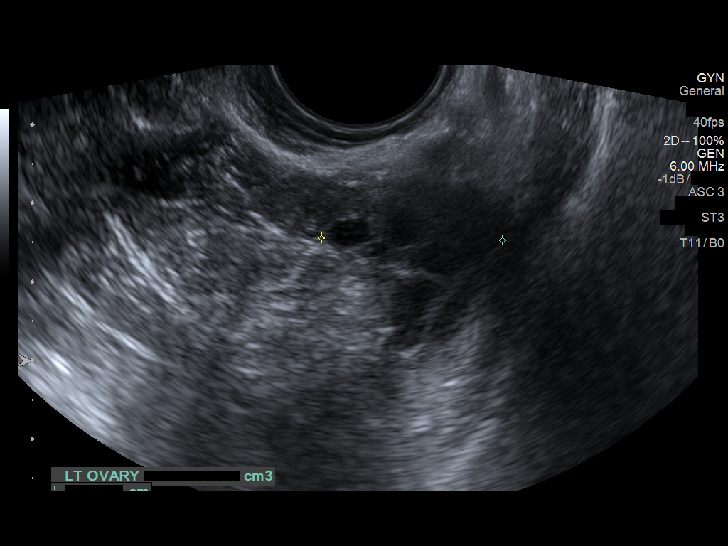
[im 48/53]
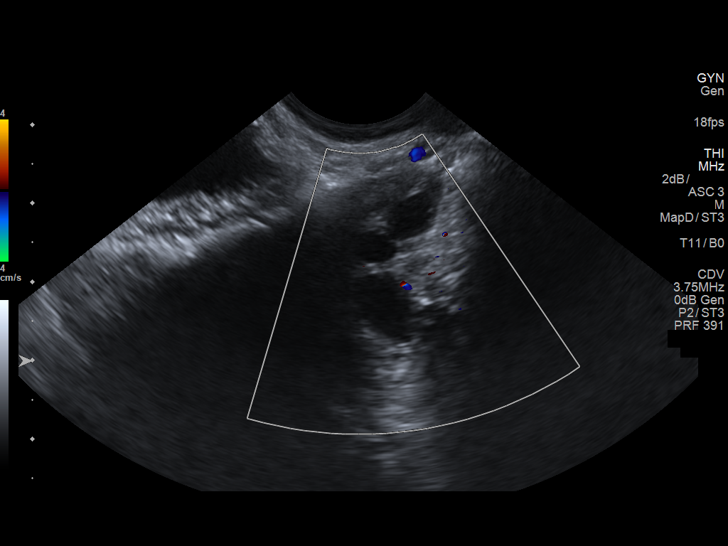
[im 53/53]
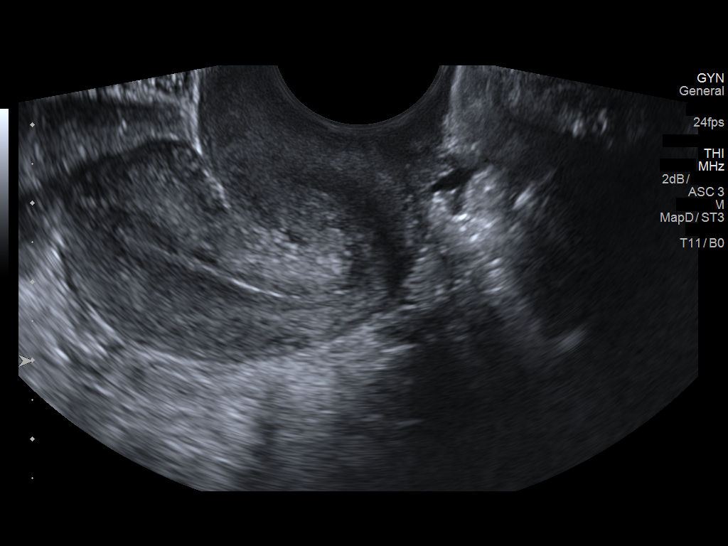

[14 of 25 positions shown; findings below may reference images not displayed]

FINDINGS: Uterus

Measurements: 4.7 x 2.6 x 3.7 cm. No fibroids or other mass
visualized.

Endometrium

Thickness: 4 mm.  No focal abnormality visualized.

Right ovary

Measurements: 3.3 x 2.1 x 2.3 cm. Normal appearance/no adnexal mass.

Left ovary

Measurements: 3.2 x 1.7 x 2.5 cm. Normal appearance/no adnexal mass.

Other findings

No abnormal free fluid.
IMPRESSION: Negative pelvic ultrasound

## 2019-04-21 ENCOUNTER — Ambulatory Visit (INDEPENDENT_AMBULATORY_CARE_PROVIDER_SITE_OTHER): Payer: Federal, State, Local not specified - PPO

## 2019-04-21 ENCOUNTER — Other Ambulatory Visit: Payer: Self-pay

## 2019-04-21 DIAGNOSIS — Z308 Encounter for other contraceptive management: Secondary | ICD-10-CM

## 2019-04-21 MED ORDER — MEDROXYPROGESTERONE ACETATE 150 MG/ML IM SUSP
150.0000 mg | Freq: Once | INTRAMUSCULAR | Status: AC
  Administered 2019-04-21: 150 mg via INTRAMUSCULAR

## 2019-04-21 NOTE — Progress Notes (Signed)
After obtaining consent, and per orders of Dr. Deborra Medina, injection of Depo-Provera given in right glute by Merina Behrendt Berneta Sages. Patient instructed to remain in clinic for 20 minutes afterwards, and to report any adverse reaction to me immediately.  Next Nurse Visit for injection should be 11.3.20-11.17.20 in Left glute/thx dmf

## 2019-04-21 NOTE — Patient Instructions (Signed)
Next Depo-Provera injection to be given in left glute from 11.3.20-11.17.20  :)

## 2019-04-21 NOTE — Progress Notes (Signed)
Medical screening examination/treatment/procedure(s) were performed by the [facility]. As primary care provider I was immediately available for consulation/collaboration. I agree with above documentation. Wilfred Lacy, AGNP-C

## 2019-05-15 DIAGNOSIS — L2084 Intrinsic (allergic) eczema: Secondary | ICD-10-CM | POA: Diagnosis not present

## 2019-06-05 ENCOUNTER — Encounter: Payer: Self-pay | Admitting: Family Medicine

## 2019-06-09 ENCOUNTER — Telehealth: Payer: Self-pay

## 2019-06-09 NOTE — Telephone Encounter (Signed)
Created note per TA/thx dmf

## 2019-07-06 ENCOUNTER — Telehealth: Payer: Self-pay

## 2019-07-06 NOTE — Telephone Encounter (Signed)
Questions for Screening COVID-19  Symptom onset: None  Travel or Contacts: None  During this illness, did/does the patient experience any of the following symptoms? Fever >100.33F []   Yes [x]   No []   Unknown Subjective fever (felt feverish) []   Yes [x]   No []   Unknown Chills []   Yes []   No []   Unknown Muscle aches (myalgia) []   Yes [x]   No []   Unknown Runny nose (rhinorrhea) []   Yes [x]   No []   Unknown Sore throat []   Yes [x]   No []   Unknown Cough (new onset or worsening of chronic cough) []   Yes [x]   No []   Unknown Shortness of breath (dyspnea) []   Yes [x]   No []   Unknown Nausea or vomiting []   Yes [x]   No []   Unknown Headache []   Yes [x]   No []   Unknown Abdominal pain  []   Yes [x]   No []   Unknown Diarrhea (?3 loose/looser than normal stools/24hr period) []   Yes [x]   No []   Unknown Other, specify:  Patient risk factors: Smoker? []   Current []   Former []   Never If female, currently pregnant? []   Yes []   No  Patient Active Problem List   Diagnosis Date Noted  . Food poisoning 03/10/2019  . History of chlamydia 07/29/2018  . Screening for STD (sexually transmitted disease) 04/28/2018  . Menorrhagia 11/30/2015  . Contraception management 03/10/2015    Plan:  []   High risk for COVID-19 with red flags go to ED (with CP, SOB, weak/lightheaded, or fever > 101.5). Call ahead.  []   High risk for COVID-19 but stable. Inform provider and coordinate time for [facility] visit.   []   No red flags but URI signs or symptoms okay for [facility] visit.

## 2019-07-07 ENCOUNTER — Ambulatory Visit (INDEPENDENT_AMBULATORY_CARE_PROVIDER_SITE_OTHER): Payer: Federal, State, Local not specified - PPO | Admitting: Behavioral Health

## 2019-07-07 ENCOUNTER — Other Ambulatory Visit: Payer: Self-pay

## 2019-07-07 DIAGNOSIS — Z308 Encounter for other contraceptive management: Secondary | ICD-10-CM

## 2019-07-07 MED ORDER — MEDROXYPROGESTERONE ACETATE 150 MG/ML IM SUSP: 150.0000 mg | Freq: Once | INTRAMUSCULAR | Status: DC

## 2019-07-07 MED ORDER — MEDROXYPROGESTERONE ACETATE 150 MG/ML IM SUSY
PREFILLED_SYRINGE | Freq: Once | INTRAMUSCULAR | Status: AC
  Administered 2019-07-07: 11:00:00 via INTRAMUSCULAR

## 2019-07-07 NOTE — Progress Notes (Signed)
Patient presents in clinic today for Depo-provera injection. IM injection given in right ventrogluteal. Patient tolerated the injection well. No signs or symptoms of a reaction were noted prior to patient leaving the nurse visit. Next appointment has been scheduled for 09/22/2019 at 9:40 AM.

## 2019-07-14 NOTE — Progress Notes (Signed)
I reviewed RN's note, was available for consultation, and agree with documentation and plan.  

## 2019-07-27 ENCOUNTER — Other Ambulatory Visit: Payer: Self-pay

## 2019-07-28 ENCOUNTER — Encounter: Payer: Self-pay | Admitting: Family Medicine

## 2019-07-28 ENCOUNTER — Ambulatory Visit: Payer: Federal, State, Local not specified - PPO | Admitting: Family Medicine

## 2019-07-28 ENCOUNTER — Other Ambulatory Visit (HOSPITAL_COMMUNITY)
Admission: RE | Admit: 2019-07-28 | Discharge: 2019-07-28 | Disposition: A | Discharge status: Home / Self Care | Payer: Federal, State, Local not specified - PPO | Source: Ambulatory Visit | Attending: Family Medicine | Admitting: Family Medicine

## 2019-07-28 VITALS — BP 110/80 | HR 93 | Temp 97.6°F | Ht 69.0 in | Wt 170.0 lb

## 2019-07-28 DIAGNOSIS — N760 Acute vaginitis: Secondary | ICD-10-CM

## 2019-07-28 DIAGNOSIS — Z113 Encounter for screening for infections with a predominantly sexual mode of transmission: Secondary | ICD-10-CM | POA: Insufficient documentation

## 2019-07-28 DIAGNOSIS — R102 Pelvic and perineal pain: Secondary | ICD-10-CM | POA: Insufficient documentation

## 2019-07-28 DIAGNOSIS — Z8619 Personal history of other infectious and parasitic diseases: Secondary | ICD-10-CM | POA: Diagnosis not present

## 2019-07-28 NOTE — Progress Notes (Signed)
Subjective:    Patient ID: Shelby SchwartzSequoia R Niblack, female    DOB: 05/22/1996, 23 y.o.   MRN: [id number]  Chief Complaint  Patient presents with  . Vaginitis    Pt has had discharge, pelvic pain, gassy, odor.  X 1 month.  . STD testing    HPI Patient is in today for pelvic pain with vaginal odor for the past two weeks. It is not itchy.  She would like STD testing.  She does have a remote history of chlamydia which was treated appropriate. She has not been having unprotected sex.    Pelvic pain- bilateral- daily, feels it progressing over past two months.  Cannot remember when she had her last period since she is on Depo- it has been years. Past Medical History:  Diagnosis Date  . ADHD (attention deficit hyperactivity disorder)    Formall evaluated by psychological services    History reviewed. No pertinent surgical history.  Family History  Problem Relation Age of Onset  . Diabetes Maternal Grandmother   . Hypertension Maternal Grandmother   . Diabetes Maternal Grandfather   . Hypertension Maternal Grandfather     Social History   Socioeconomic History  . Marital status: Single    Spouse name: Not on file  . Number of children: Not on file  . Years of education: Not on file  . Highest education level: Not on file  Occupational History  . Not on file  Social Needs  . Financial resource strain: Not on file  . Food insecurity    Worry: Not on file    Inability: Not on file  . Transportation needs    Medical: Not on file    Non-medical: Not on file  Tobacco Use  . Smoking status: Never Smoker  . Smokeless tobacco: Never Used  Substance and Sexual Activity  . Alcohol use: No  . Drug use: No  . Sexual activity: Never  Lifestyle  . Physical activity    Days per week: Not on file    Minutes per session: Not on file  . Stress: Not on file  Relationships  . Social Musicianconnections    Talks on phone: Not on file    Gets together: Not on file    Attends religious  service: Not on file    Active member of club or organization: Not on file    Attends meetings of clubs or organizations: Not on file    Relationship status: Not on file  . Intimate partner violence    Fear of current or ex partner: Not on file    Emotionally abused: Not on file    Physically abused: Not on file    Forced sexual activity: Not on file  Other Topics Concern  . 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   Runs track    Outpatient Medications Prior to Visit  Medication Sig Dispense Refill  . medroxyPROGESTERone (DEPO-PROVERA) 150 MG/ML injection Inject 150 mg into the muscle every 3 (three) months.     No facility-administered medications prior to visit.     No Known Allergies  Review of Systems  Constitutional: Negative for fever and malaise/fatigue.  HENT: Negative for congestion and hearing loss.   Eyes: Negative for blurred vision, discharge and redness.  Respiratory: Negative for cough and shortness of breath.   Cardiovascular: Negative for chest pain, palpitations and  leg swelling.  Gastrointestinal: Negative for abdominal pain and heartburn.  Genitourinary: Negative for dysuria.       +vaginal discharge with odor +intermittent pelvic pain  Musculoskeletal: Negative for falls.  Skin: Negative for rash.  Neurological: Negative for loss of consciousness and headaches.  Endo/Heme/Allergies: Does not bruise/bleed easily.  Psychiatric/Behavioral: Negative for depression.       Objective:    BP 110/80 (BP Location: Right Arm, Patient Position: Sitting, Cuff Size: Normal)   Pulse 93   Temp 97.6 F (36.4 C) (Temporal)   Ht 5\' 9"  (1.753 m)   Wt 170 lb (77.1 kg)   SpO2 100%   BMI 25.10 kg/m   Physical Exam Vitals signs and nursing note reviewed. Exam conducted with a chaperone present.  Constitutional:      Appearance: Normal appearance. She is not ill-appearing.   HENT:     Head: Normocephalic and atraumatic.     Right Ear: External ear normal.     Left Ear: External ear normal.     Nose: Nose normal.  Eyes:     General:        Right eye: No discharge.        Left eye: No discharge.  Cardiovascular:     Rate and Rhythm: Normal rate and regular rhythm.     Heart sounds: Normal heart sounds.  Pulmonary:     Effort: Pulmonary effort is normal.     Breath sounds: No wheezing.  Abdominal:     General: Abdomen is flat. There is no distension.     Palpations: Abdomen is soft. There is no mass.     Tenderness: There is abdominal tenderness. There is no right CVA tenderness, guarding or rebound.     Hernia: No hernia is present. There is no hernia in the left inguinal area or right inguinal area.  Genitourinary:    Exam position: Supine.     Pubic Area: No rash.      Labia:        Right: No rash, tenderness, lesion or injury.        Left: No tenderness, lesion or injury.      Urethra: No urethral pain, urethral swelling or urethral lesion.     Vagina: Vaginal discharge present.     Cervix: Discharge present.     Uterus: Normal.      Adnexa:        Right: Tenderness present. No mass or fullness.         Left: Tenderness present. No mass or fullness.       Rectum: Normal.  Musculoskeletal: Normal range of motion.     Right lower leg: No edema.     Left lower leg: No edema.  Lymphadenopathy:     Lower Body: No right inguinal adenopathy. No left inguinal adenopathy.  Skin:    General: Skin is dry.  Neurological:     Mental Status: She is alert and oriented to person, place, and time.     Deep Tendon Reflexes: Reflexes normal.  Psychiatric:        Mood and Affect: Mood normal.        Behavior: Behavior normal.     BP 110/80 (BP Location: Right Arm, Patient Position: Sitting, Cuff Size: Normal)   Pulse 93   Temp 97.6 F (36.4 C) (Temporal)   Ht 5\' 9"  (1.753 m)   Wt 170 lb (77.1 kg)   SpO2 100%   BMI 25.10 kg/m  Wt Readings from  Last 3 Encounters:  07/28/19 170 lb (77.1 kg)  10/30/18 162 lb 6.4 oz (73.7 kg)  10/09/18 159 lb 6.4 oz (72.3 kg)     Lab Results  Component Value Date   WBC 6.6 04/28/2018   HGB 13.4 04/28/2018   HCT 41.0 04/28/2018   PLT 340.0 04/28/2018   GLUCOSE 110 (H) 04/28/2018   CHOL 127 04/28/2018   TRIG 48.0 04/28/2018   HDL 50.30 04/28/2018   LDLCALC 68 04/28/2018   ALT 12 04/28/2018   AST 19 04/28/2018   NA 139 04/28/2018   K 4.1 04/28/2018   CL 108 04/28/2018   CREATININE 0.80 04/28/2018   BUN 12 04/28/2018   CO2 24 04/28/2018   TSH 1.51 04/28/2018    Lab Results  Component Value Date   TSH 1.51 04/28/2018   Lab Results  Component Value Date   WBC 6.6 04/28/2018   HGB 13.4 04/28/2018   HCT 41.0 04/28/2018   MCV 83.0 04/28/2018   PLT 340.0 04/28/2018   Lab Results  Component Value Date   NA 139 04/28/2018   K 4.1 04/28/2018   CO2 24 04/28/2018   GLUCOSE 110 (H) 04/28/2018   BUN 12 04/28/2018   CREATININE 0.80 04/28/2018   BILITOT 0.4 04/28/2018   ALKPHOS 47 04/28/2018   AST 19 04/28/2018   ALT 12 04/28/2018   PROT 7.3 04/28/2018   ALBUMIN 4.3 04/28/2018   CALCIUM 9.7 04/28/2018   GFR 116.26 04/28/2018   Lab Results  Component Value Date   CHOL 127 04/28/2018   Lab Results  Component Value Date   HDL 50.30 04/28/2018   Lab Results  Component Value Date   LDLCALC 68 04/28/2018   Lab Results  Component Value Date   TRIG 48.0 04/28/2018   Lab Results  Component Value Date   CHOLHDL 3 04/28/2018   No results found for: HGBA1C     Assessment & Plan:   Problem List Items Addressed This Visit      Active Problems   Screening for STD (sexually transmitted disease) - Primary    Discussed sexual activity, using condoms, pregnancy risk, and STD risk.   STD testing done today including RPR, HIV and cytology - GC/Chlamydia, trichomonas, clue cells.      Relevant Orders   HIV antibody (with reflex)   RPR   Cervicovaginal ancillary only( [facility])   History of chlamydia   Relevant Orders   Cervicovaginal ancillary only( [place])   Vaginitis   Relevant Orders   Cervicovaginal ancillary only( [place])   Pelvic pain in female   Relevant Orders   US Pelvis Complete      I am having Tequisha R. Elison maintain her medroxyPROGESTERone.  No orders of the defined types were placed in this encounter.    Arnette Norris, MD  This visit occurred during the SARS-CoV-2 public health emergency.  Safety protocols were in place, including screening questions prior to the visit, additional usage of staff PPE, and extensive cleaning of exam room while observing appropriate contact time as indicated for disinfecting solutions.

## 2019-07-28 NOTE — Patient Instructions (Addendum)
Health Maintenance Due  Topic Date Due  . INFLUENZA VACCINE  04/04/2019    Depression screen [facility] 2/9 10/30/2018 04/28/2018 04/11/2017  Decreased Interest 0 0 0  Down, Depressed, Hopeless 0 0 0  PHQ - 2 Score 0 0 0   Great to see you. I will call you with your lab results from today and you can view them online.   Imaging center will call you to schedule the [place].

## 2019-07-28 NOTE — Assessment & Plan Note (Signed)
Discussed sexual activity, using condoms, pregnancy risk, and STD risk.   STD testing done today including RPR, HIV and cytology - GC/Chlamydia, trichomonas, clue cells.

## 2019-07-29 LAB — RPR: RPR Ser Ql: NONREACTIVE

## 2019-07-29 LAB — HIV ANTIBODY (ROUTINE TESTING W REFLEX): HIV 1&2 Ab, 4th Generation: NONREACTIVE

## 2019-07-31 LAB — CERVICOVAGINAL ANCILLARY ONLY
Bacterial Vaginitis (gardnerella): POSITIVE — AB
Chlamydia: NEGATIVE
Comment: NEGATIVE
Comment: NEGATIVE
Comment: NEGATIVE
Comment: NORMAL
Neisseria Gonorrhea: NEGATIVE
Trichomonas: NEGATIVE

## 2019-08-01 ENCOUNTER — Other Ambulatory Visit: Payer: Self-pay | Admitting: Family Medicine

## 2019-08-01 DIAGNOSIS — R102 Pelvic and perineal pain: Secondary | ICD-10-CM

## 2019-08-01 MED ORDER — METRONIDAZOLE 500 MG PO TABS: 500.0000 mg | ORAL_TABLET | Freq: Two times a day (BID) | ORAL | 0 refills | Status: AC

## 2019-08-11 ENCOUNTER — Ambulatory Visit
Admission: RE | Admit: 2019-08-11 | Discharge: 2019-08-11 | Disposition: A | Discharge status: Home / Self Care | Payer: Federal, State, Local not specified - PPO | Source: Ambulatory Visit | Attending: Family Medicine | Admitting: Family Medicine

## 2019-08-11 DIAGNOSIS — R102 Pelvic and perineal pain: Secondary | ICD-10-CM | POA: Diagnosis not present

## 2019-09-03 DIAGNOSIS — R102 Pelvic and perineal pain: Secondary | ICD-10-CM | POA: Diagnosis not present

## 2019-09-03 DIAGNOSIS — N760 Acute vaginitis: Secondary | ICD-10-CM | POA: Diagnosis not present

## 2019-09-03 DIAGNOSIS — Z01419 Encounter for gynecological examination (general) (routine) without abnormal findings: Secondary | ICD-10-CM | POA: Diagnosis not present

## 2019-09-03 DIAGNOSIS — Z6825 Body mass index (BMI) 25.0-25.9, adult: Secondary | ICD-10-CM | POA: Diagnosis not present

## 2019-09-03 DIAGNOSIS — N926 Irregular menstruation, unspecified: Secondary | ICD-10-CM | POA: Diagnosis not present

## 2019-09-21 ENCOUNTER — Other Ambulatory Visit: Payer: Self-pay

## 2019-09-22 ENCOUNTER — Ambulatory Visit (INDEPENDENT_AMBULATORY_CARE_PROVIDER_SITE_OTHER): Payer: Federal, State, Local not specified - PPO | Admitting: Behavioral Health

## 2019-09-22 DIAGNOSIS — Z308 Encounter for other contraceptive management: Secondary | ICD-10-CM | POA: Diagnosis not present

## 2019-09-22 MED ORDER — MEDROXYPROGESTERONE ACETATE 150 MG/ML IM SUSY: PREFILLED_SYRINGE | Freq: Once | INTRAMUSCULAR | Status: AC

## 2019-09-22 NOTE — Progress Notes (Signed)
Patient presents in clinic today for Depo-Provera injection. IM injection given in left ventrogluteal. Patient tolerated the injection well. No signs or symptoms of a reaction were noted prior to patient leaving the nurse visit. Next appointment has been scheduled for 12/16/2019 at 10:00 AM.

## 2019-09-22 NOTE — Progress Notes (Signed)
I reviewed RN's note, was available for consultation, and agree with documentation and plan.  

## 2019-11-17 ENCOUNTER — Ambulatory Visit: Payer: Federal, State, Local not specified - PPO | Admitting: Family Medicine

## 2019-11-17 ENCOUNTER — Encounter: Payer: Self-pay | Admitting: Family Medicine

## 2019-11-17 ENCOUNTER — Other Ambulatory Visit: Payer: Self-pay

## 2019-11-17 VITALS — BP 112/64 | HR 80 | Temp 97.9°F | Resp 10 | Ht 69.0 in | Wt 170.8 lb

## 2019-11-17 DIAGNOSIS — J302 Other seasonal allergic rhinitis: Secondary | ICD-10-CM | POA: Diagnosis not present

## 2019-11-17 DIAGNOSIS — Z114 Encounter for screening for human immunodeficiency virus [HIV]: Secondary | ICD-10-CM | POA: Diagnosis not present

## 2019-11-17 DIAGNOSIS — Z1159 Encounter for screening for other viral diseases: Secondary | ICD-10-CM

## 2019-11-17 DIAGNOSIS — Z113 Encounter for screening for infections with a predominantly sexual mode of transmission: Secondary | ICD-10-CM | POA: Diagnosis not present

## 2019-11-17 DIAGNOSIS — R102 Pelvic and perineal pain: Secondary | ICD-10-CM

## 2019-11-17 NOTE — Assessment & Plan Note (Signed)
Discussed trial of OTC long 24 hour allergy medication. Add Flonase is persistent symptoms.

## 2019-11-17 NOTE — Progress Notes (Signed)
Subjective:     Shelby Andrews is a 24 y.o. female presenting for Transfer of Care (from Dr Dayton Martes) and Seasonal allergies (discuss)     HPI   #Hx of ADHD  - doing well off medications in college - took vyvanse as a child  #Pelvic inflammation - no BV or yeast infection - negative work-up thus - thought it was a cyst - pain Oct - December - was not getting testing done as regular - had an US done and exam w/ normal work-up - GYN thought cyst - no pain with intercourse  - pain is improving overall  - more of a cramp - no discharge  - endorses itching - hx of chlamydia   #Allergies - every spring x 2 years - sneezing fits - runny nose - some sore throat - most sneezing fits - has not tried   Review of Systems   Social History   Tobacco Use  Smoking Status Never Smoker  Smokeless Tobacco Never Used        Objective:    BP Readings from Last 3 Encounters:  11/17/19 112/64  07/28/19 110/80  10/30/18 110/60   Wt Readings from Last 3 Encounters:  11/17/19 170 lb 12 oz (77.5 kg)  07/28/19 170 lb (77.1 kg)  10/30/18 162 lb 6.4 oz (73.7 kg)    BP 112/64   Pulse 80   Temp 97.9 F (36.6 C)   Resp 10   Ht 5\' 9"  (1.753 m)   Wt 170 lb 12 oz (77.5 kg)   BMI 25.22 kg/m    Physical Exam Constitutional:      General: She is not in acute distress.    Appearance: She is well-developed. She is not diaphoretic.  HENT:     Head: Normocephalic and atraumatic.     Right Ear: Tympanic membrane and external ear normal.     Left Ear: Tympanic membrane and external ear normal.     Nose: Nose normal. No congestion or rhinorrhea.     Mouth/Throat:     Mouth: Mucous membranes are moist.     Pharynx: No oropharyngeal exudate or posterior oropharyngeal erythema.  Eyes:     Conjunctiva/sclera: Conjunctivae normal.  Cardiovascular:     Rate and Rhythm: Normal rate.  Pulmonary:     Effort: Pulmonary effort is normal.  Musculoskeletal:     Cervical back: Neck  supple.  Skin:    General: Skin is warm and dry.     Capillary Refill: Capillary refill takes less than 2 seconds.  Neurological:     Mental Status: She is alert. Mental status is at baseline.  Psychiatric:        Mood and Affect: Mood normal.        Behavior: Behavior normal.           Assessment & Plan:   Problem List Items Addressed This Visit      Other   Pelvic pain in female    Persistent symptoms which are improving but work-up thus far has been negative. She would like to have repeat STI testing today. Hx of chlamydia and recent testing was negative      Seasonal allergies - Primary    Discussed trial of OTC long 24 hour allergy medication. Add Flonase is persistent symptoms.        Other Visit Diagnoses    Routine screening for STI (sexually transmitted infection)       Relevant Orders   Hepatitis C  antibody   HIV Antibody (routine testing w rflx)   RPR   C. trachomatis/N. gonorrhoeae RNA   Screening for HIV (human immunodeficiency virus)       Relevant Orders   HIV Antibody (routine testing w rflx)   Encounter for hepatitis C screening test for low risk patient       Relevant Orders   Hepatitis C antibody       Return in about 1 year (around 11/16/2020).  Lesleigh Noe, MD

## 2019-11-17 NOTE — Assessment & Plan Note (Signed)
Persistent symptoms which are improving but work-up thus far has been negative. She would like to have repeat STI testing today. Hx of chlamydia and recent testing was negative

## 2019-11-17 NOTE — Patient Instructions (Addendum)
#  Allergies - try an over the counter medication (Claritin, Allegra, Zyrtec) - store brand is OK - if some improvement, but still with sinus issues > try Flonase nasal spray  Financial trader -- Stage manager opportunities

## 2019-11-18 LAB — HEPATITIS C ANTIBODY
Hepatitis C Ab: NONREACTIVE
SIGNAL TO CUT-OFF: 0.01 (ref <1.00)

## 2019-11-18 LAB — HIV ANTIBODY (ROUTINE TESTING W REFLEX): HIV 1&2 Ab, 4th Generation: NONREACTIVE

## 2019-11-18 LAB — RPR: RPR Ser Ql: NONREACTIVE

## 2019-11-18 LAB — C. TRACHOMATIS/N. GONORRHOEAE RNA
C. trachomatis RNA, TMA: NOT DETECTED
N. gonorrhoeae RNA, TMA: NOT DETECTED

## 2019-12-09 DIAGNOSIS — L209 Atopic dermatitis, unspecified: Secondary | ICD-10-CM | POA: Diagnosis not present

## 2019-12-16 ENCOUNTER — Ambulatory Visit: Payer: Federal, State, Local not specified - PPO

## 2019-12-22 ENCOUNTER — Ambulatory Visit (INDEPENDENT_AMBULATORY_CARE_PROVIDER_SITE_OTHER): Payer: Federal, State, Local not specified - PPO

## 2019-12-22 DIAGNOSIS — Z308 Encounter for other contraceptive management: Secondary | ICD-10-CM | POA: Diagnosis not present

## 2019-12-22 MED ORDER — MEDROXYPROGESTERONE ACETATE 150 MG/ML IM SUSP
150.0000 mg | Freq: Once | INTRAMUSCULAR | Status: AC
  Administered 2019-12-22: 150 mg via INTRAMUSCULAR

## 2019-12-22 NOTE — Progress Notes (Signed)
Per orders of Dr. Ermalene Searing in Dr. Elmyra Ricks absence, injection of depo-provera given by Sherrie George. Patient tolerated injection well.

## 2020-02-17 ENCOUNTER — Encounter: Payer: Federal, State, Local not specified - PPO | Admitting: Internal Medicine

## 2020-03-10 ENCOUNTER — Ambulatory Visit: Payer: Federal, State, Local not specified - PPO

## 2020-03-10 ENCOUNTER — Encounter: Payer: Self-pay | Admitting: Family Medicine

## 2020-03-10 ENCOUNTER — Ambulatory Visit: Payer: Federal, State, Local not specified - PPO | Admitting: Internal Medicine

## 2020-03-10 ENCOUNTER — Ambulatory Visit: Payer: Federal, State, Local not specified - PPO | Admitting: Family Medicine

## 2020-03-10 ENCOUNTER — Other Ambulatory Visit: Payer: Self-pay

## 2020-03-10 VITALS — BP 118/62 | HR 98 | Temp 97.5°F | Ht 69.0 in | Wt 174.0 lb

## 2020-03-10 DIAGNOSIS — K649 Unspecified hemorrhoids: Secondary | ICD-10-CM | POA: Insufficient documentation

## 2020-03-10 DIAGNOSIS — K648 Other hemorrhoids: Secondary | ICD-10-CM

## 2020-03-10 DIAGNOSIS — Z308 Encounter for other contraceptive management: Secondary | ICD-10-CM

## 2020-03-10 DIAGNOSIS — K921 Melena: Secondary | ICD-10-CM

## 2020-03-10 DIAGNOSIS — K5900 Constipation, unspecified: Secondary | ICD-10-CM | POA: Diagnosis not present

## 2020-03-10 MED ORDER — MEDROXYPROGESTERONE ACETATE 150 MG/ML IM SUSP
150.0000 mg | Freq: Once | INTRAMUSCULAR | Status: AC
  Administered 2020-03-10: 150 mg via INTRAMUSCULAR

## 2020-03-10 MED ORDER — HYDROCORT-PRAMOXINE (PERIANAL) 2.5-1 % EX CREA: TOPICAL_CREAM | Freq: Two times a day (BID) | CUTANEOUS | 0 refills | Status: DC

## 2020-03-10 NOTE — Progress Notes (Signed)
Chief Complaint  Patient presents with  . Abdominal Pain  . Constipation    History of Present Illness: HPI    24 year old female pt of Dr. Elmyra Ricks presents with new onset pain in abdomen and constipation.  She reports  she has very firm stool x 3 weeks. BMs 1-2 times daily. Occ seeing blood on toilet tissue when wiping. Some rectal pain with BM.  Abdominal cramping off and on.   No history of constipation.  In last 3 weeks she has been working out ore.. eating more salad and salmon. No med change. Has  16-24 oz a day.   She has not tried any medication.   She is due for DEPO.Marland Kitchen   No family history of co Cancer , UC, Crohn's  This visit occurred during the SARS-CoV-2 public health emergency.  Safety protocols were in place, including screening questions prior to the visit, additional usage of staff PPE, and extensive cleaning of exam room while observing appropriate contact time as indicated for disinfecting solutions.   COVID 19 screen:  No recent travel or known exposure to COVID19 The patient denies respiratory symptoms of COVID 19 at this time. The importance of social distancing was discussed today.     Review of Systems  Constitutional: Negative for chills and fever.       No cold intolerance  HENT: Negative for congestion and ear pain.   Eyes: Negative for pain and redness.  Respiratory: Negative for cough and shortness of breath.   Cardiovascular: Negative for chest pain, palpitations and leg swelling.  Gastrointestinal: Negative for abdominal pain, blood in stool, constipation, diarrhea, nausea and vomiting.  Genitourinary: Negative for dysuria.  Musculoskeletal: Negative for falls and myalgias.  Skin: Negative for rash.  Neurological: Negative for dizziness.  Psychiatric/Behavioral: Negative for depression. The patient is not nervous/anxious.       Past Medical History:  Diagnosis Date  . ADHD (attention deficit hyperactivity disorder)    Formall evaluated  by psychological services    reports that she has never smoked. She has never used smokeless tobacco. She reports current alcohol use. She reports current drug use. Frequency: 2.00 times per week. Drug: Marijuana.   Current Outpatient Medications:  .  medroxyPROGESTERone (DEPO-PROVERA) 150 MG/ML injection, Inject 150 mg into the muscle every 3 (three) months., Disp: , Rfl:    Observations/Objective: Blood pressure 118/62, pulse 98, temperature (!) 97.5 F (36.4 C), temperature source Temporal, height 5\' 9"  (1.753 m), weight 174 lb (78.9 kg), SpO2 98 %.  Physical Exam Constitutional:      General: She is not in acute distress.    Appearance: Normal appearance. She is well-developed. She is not ill-appearing or toxic-appearing.  HENT:     Head: Normocephalic.     Right Ear: Hearing, tympanic membrane, ear canal and external ear normal. Tympanic membrane is not erythematous, retracted or bulging.     Left Ear: Hearing, tympanic membrane, ear canal and external ear normal. Tympanic membrane is not erythematous, retracted or bulging.     Nose: No mucosal edema or rhinorrhea.     Right Sinus: No maxillary sinus tenderness or frontal sinus tenderness.     Left Sinus: No maxillary sinus tenderness or frontal sinus tenderness.     Mouth/Throat:     Pharynx: Uvula midline.  Eyes:     General: Lids are normal. Lids are everted, no foreign bodies appreciated.     Conjunctiva/sclera: Conjunctivae normal.     Pupils: Pupils are equal, round,  and reactive to light.  Neck:     Thyroid: No thyroid mass or thyromegaly.     Vascular: No carotid bruit.     Trachea: Trachea normal.  Cardiovascular:     Rate and Rhythm: Normal rate and regular rhythm.     Pulses: Normal pulses.     Heart sounds: Normal heart sounds, S1 normal and S2 normal. No murmur heard.  No friction rub. No gallop.   Pulmonary:     Effort: Pulmonary effort is normal. No tachypnea or respiratory distress.     Breath sounds:  Normal breath sounds. No decreased breath sounds, wheezing, rhonchi or rales.  Abdominal:     General: Bowel sounds are normal.     Palpations: Abdomen is soft.     Tenderness: There is abdominal tenderness in the left lower quadrant.     Comments: Mild LLQ pressure, not pain per pt  Musculoskeletal:     Cervical back: Normal range of motion and neck supple.  Skin:    General: Skin is warm and dry.     Findings: No rash.  Neurological:     Mental Status: She is alert.  Psychiatric:        Mood and Affect: Mood is not anxious or depressed.        Speech: Speech normal.        Behavior: Behavior normal. Behavior is cooperative.        Thought Content: Thought content normal.        Judgment: Judgment normal.    No hemeoccult.  After explaining the procedure, informed consent was obtained.   Using the anoscope instrument, anoscopy was carried out.  Proceeded to 2-3 cm.  Findings: normal mucosa without  Tumors, small non bleeding internal hemorrhoids noted, no biopsies taken, bowel prep was adequate, procedure well tolerated without complications.    No complications were encountered;  the procedure was well  tolerated.    ASSESSMENT:  Internal hemorrhoids  Assessment and Plan      Kerby Nora, MD

## 2020-03-10 NOTE — Addendum Note (Signed)
Addended by: Janelle Floor B on: 03/10/2020 02:46 PM   Modules accepted: Orders

## 2020-03-10 NOTE — Patient Instructions (Addendum)
Increase fiber and water  in diet. Double water intake. Start benefiber and gradaully increase over time. See info packet to increase fiber in diet. If still no  regular bowel movement you can try Miralax daily to BM.  Apply topical cream internally on finger tip or with applicator  twice daily for internal hemorrhoids.

## 2020-03-10 NOTE — Assessment & Plan Note (Signed)
Likely due to diet changes.  Increase fiber and water in diet.  Can use miralax prn. Info on high fiber diet given.

## 2020-03-10 NOTE — Assessment & Plan Note (Signed)
Secondary to constipation. Treat constipation. Use topical steroid cream to decrease hemorrhoid symptoms.

## 2020-03-23 ENCOUNTER — Telehealth: Payer: Self-pay | Admitting: Family Medicine

## 2020-03-23 NOTE — Telephone Encounter (Signed)
Fine with me

## 2020-03-23 NOTE — Telephone Encounter (Signed)
Pt is currently scheduled to transfer to [facility] on 7/[facility]. It looks like this was never approved. Is it okay for patient to transfer from Dr. Selena Batten to [facility]?

## 2020-03-23 NOTE — Telephone Encounter (Signed)
Ok with me 

## 2020-03-29 ENCOUNTER — Encounter: Payer: Federal, State, Local not specified - PPO | Admitting: Internal Medicine

## 2020-05-03 ENCOUNTER — Encounter: Payer: Federal, State, Local not specified - PPO | Admitting: Internal Medicine

## 2020-05-03 DIAGNOSIS — Z0289 Encounter for other administrative examinations: Secondary | ICD-10-CM

## 2020-05-03 NOTE — Progress Notes (Deleted)
HPI  Patient presents the clinic today to establish care and for management of the conditions listed below.  ADHD:  Flu: 06/2018 Tetanus: 04/2018 Covid: Pap smear: 04/2018 Dentist:  Past Medical History:  Diagnosis Date  . ADHD (attention deficit hyperactivity disorder)    Formall evaluated by psychological services    Current Outpatient Medications  Medication Sig Dispense Refill  . hydrocortisone-pramoxine (ANALPRAM-HC) 2.5-1 % rectal cream Place rectally in the morning and at bedtime. 30 g 0  . medroxyPROGESTERone (DEPO-PROVERA) 150 MG/ML injection Inject 150 mg into the muscle every 3 (three) months.     No current facility-administered medications for this visit.    No Known Allergies  Family History  Problem Relation Age of Onset  . Diabetes Mother   . Diabetes Maternal Grandmother   . Hypertension Maternal Grandmother   . Congestive Heart Failure Maternal Grandmother   . Diabetes Maternal Grandfather   . Hypertension Maternal Grandfather   . Stroke Maternal Grandfather 43    Social History   Socioeconomic History  . Marital status: Single    Spouse name: Not on file  . Number of children: Not on file  . Years of education: Not on file  . Highest education level: Not on file  Occupational History  . Not on file  Tobacco Use  . Smoking status: Never Smoker  . Smokeless tobacco: Never Used  Vaping Use  . Vaping Use: Never used  Substance and Sexual Activity  . Alcohol use: Yes    Comment: a few times a month, 2-3 drinks  . Drug use: Yes    Frequency: 2.0 times per week    Types: Marijuana  . Sexual activity: Yes    Birth control/protection: Injection, Condom    Comment: condoms everytime  Other Topics Concern  . 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 Difficulty of Paying Living Expenses: Not on file  Food Insecurity:   . Worried About Programme researcher, broadcasting/film/video in the Last Year: Not on file  . Ran Out of Food in the Last Year: Not on file  Transportation Needs:   . Lack of Transportation (Medical): Not on file  . Lack of Transportation (Non-Medical): Not on file  Physical Activity:   . Days of Exercise per Week: Not on file  . Minutes of Exercise per Session: Not on file  Stress:   . Feeling of Stress : Not on file  Social Connections:   . Frequency of Communication with Friends and Family: Not on file  . Frequency of Social Gatherings with Friends and Family: Not on file  . Attends Religious Services: Not on file  . Active Member of Clubs or Organizations: Not on file  . Attends Banker Meetings: Not on file  .  Marital Status: Not on file  Intimate Partner Violence:   . Fear of Current or Ex-Partner: Not on file  . Emotionally Abused: Not on file  . Physically Abused: Not on file  . Sexually Abused: Not on file    ROS:  Constitutional: Denies fever, malaise, fatigue, headache or abrupt weight changes.  HEENT: Denies eye pain, eye redness, ear pain, ringing in the ears, wax buildup, runny nose, nasal congestion, bloody nose, or sore throat. Respiratory: Denies difficulty breathing, shortness of breath, cough or sputum production.   Cardiovascular: Denies chest pain, chest tightness, palpitations or swelling in the hands or feet.  Gastrointestinal: Denies abdominal pain, bloating, constipation, diarrhea or blood in the stool.  GU: Denies  frequency, urgency, pain with urination, blood in urine, odor or discharge. Musculoskeletal: Denies decrease in range of motion, difficulty with gait, muscle pain or joint pain and swelling.  Skin: Denies redness, rashes, lesions or ulcercations.  Neurological: Denies dizziness, difficulty with memory, difficulty with speech or problems with balance and coordination.  Psych: Denies anxiety, depression, SI/HI.  No other specific complaints in a complete review of systems (except as listed in HPI above).  PE:  There were no vitals taken for this visit. Wt Readings from Last 3 Encounters:  03/10/20 174 lb (78.9 kg)  11/17/19 170 lb 12 oz (77.5 kg)  07/28/19 170 lb (77.1 kg)    General: Appears their stated age, well developed, well nourished in NAD. HEENT: Head: normal shape and size; Eyes: sclera white, no icterus, conjunctiva pink, PERRLA and EOMs intact; Ears: Tm's gray and intact, normal light reflex;Throat/Mouth: Teeth present, mucosa pink and moist, no lesions or ulcerations noted.  Neck: Neck supple, trachea midline. No masses, lumps or thyromegaly present.  Cardiovascular: Normal rate and rhythm. S1,S2 noted.  No murmur, rubs or gallops noted. No JVD or BLE edema. No carotid bruits noted. Pulmonary/Chest: Normal effort and positive vesicular breath sounds. No respiratory distress. No wheezes, rales or ronchi noted.  Abdomen: Soft and nontender. Normal bowel sounds, no bruits noted. No distention or masses noted. Liver, spleen and kidneys non palpable. Musculoskeletal: Normal range of motion. Strength 5/5 BUE/BLE. No signs of joint swelling. No difficulty with gait.  Neurological: Alert and oriented. Cranial nerves II-XII grossly intact. Coordination normal.  Psychiatric: Mood and affect normal. Behavior is normal. Judgment and thought content normal.     BMET    Component Value Date/Time   NA 139 04/28/2018 1537   K 4.1 04/28/2018 1537   CL 108 04/28/2018 1537   CO2 24  04/28/2018 1537   GLUCOSE 110 (H) 04/28/2018 1537   BUN 12 04/28/2018 1537   CREATININE 0.80 04/28/2018 1537   CALCIUM 9.7 04/28/2018 1537    Lipid Panel     Component Value Date/Time   CHOL 127 04/28/2018 1537   TRIG 48.0 04/28/2018 1537   HDL 50.30 04/28/2018 1537   CHOLHDL 3 04/28/2018 1537   VLDL 9.6 04/28/2018 1537   LDLCALC 68 04/28/2018 1537    CBC    Component Value Date/Time   WBC 6.6 04/28/2018 1537   RBC 4.93 04/28/2018 1537   HGB 13.4 04/28/2018 1537   HCT 41.0 04/28/2018 1537   PLT 340.0 04/28/2018 1537   MCV 83.0 04/28/2018 1537   MCH 26.6 10/03/2012 1528   MCHC 32.7 04/28/2018 1537   RDW 13.6 04/28/2018 1537   LYMPHSABS 3.0 04/11/2017 1236   MONOABS 0.4 04/11/2017 1236   EOSABS 0.3 04/11/2017 1236   BASOSABS 0.0 04/11/2017  1236    Hgb A1C No results found for: HGBA1C   Assessment and Plan:  Nicki Reaper, NP This visit occurred during the SARS-CoV-2 public health emergency.  Safety protocols were in place, including screening questions prior to the visit, additional usage of staff PPE, and extensive cleaning of exam room while observing appropriate contact time as indicated for disinfecting solutions.

## 2020-06-08 ENCOUNTER — Ambulatory Visit (INDEPENDENT_AMBULATORY_CARE_PROVIDER_SITE_OTHER): Payer: Federal, State, Local not specified - PPO

## 2020-06-08 ENCOUNTER — Other Ambulatory Visit: Payer: Self-pay

## 2020-06-08 ENCOUNTER — Encounter: Payer: Self-pay | Admitting: Family Medicine

## 2020-06-08 DIAGNOSIS — Z3042 Encounter for surveillance of injectable contraceptive: Secondary | ICD-10-CM

## 2020-06-08 MED ORDER — MEDROXYPROGESTERONE ACETATE 150 MG/ML IM SUSP
150.0000 mg | Freq: Once | INTRAMUSCULAR | Status: AC
  Administered 2020-06-08: 150 mg via INTRAMUSCULAR

## 2020-06-08 MED ORDER — HYDROCORT-PRAMOXINE (PERIANAL) 2.5-1 % EX CREA: TOPICAL_CREAM | Freq: Two times a day (BID) | CUTANEOUS | 0 refills | Status: DC

## 2020-06-08 NOTE — Telephone Encounter (Signed)
Pt was seen in the office for her depo provera injection... pt is requesting a refill of rectal cream that was prescribed by Dr Ermalene Searing... please advise

## 2020-06-08 NOTE — Progress Notes (Signed)
Per orders of Dr. Gweneth Dimitri, injection of Depo Provera RUOQ given by Roena Malady.  Patient tolerated injection well.  Next due  Dec 22 - Jan 5

## 2020-06-13 ENCOUNTER — Ambulatory Visit: Payer: Federal, State, Local not specified - PPO | Admitting: Family Medicine

## 2020-06-13 ENCOUNTER — Other Ambulatory Visit: Payer: Self-pay

## 2020-06-13 ENCOUNTER — Encounter: Payer: Self-pay | Admitting: Family Medicine

## 2020-06-13 VITALS — BP 122/78 | HR 83 | Temp 97.9°F | Ht 69.0 in | Wt 175.5 lb

## 2020-06-13 DIAGNOSIS — Z114 Encounter for screening for human immunodeficiency virus [HIV]: Secondary | ICD-10-CM

## 2020-06-13 DIAGNOSIS — Z1159 Encounter for screening for other viral diseases: Secondary | ICD-10-CM

## 2020-06-13 DIAGNOSIS — Z0000 Encounter for general adult medical examination without abnormal findings: Secondary | ICD-10-CM

## 2020-06-13 DIAGNOSIS — Z113 Encounter for screening for infections with a predominantly sexual mode of transmission: Secondary | ICD-10-CM | POA: Diagnosis not present

## 2020-06-13 DIAGNOSIS — Z23 Encounter for immunization: Secondary | ICD-10-CM

## 2020-06-13 DIAGNOSIS — N898 Other specified noninflammatory disorders of vagina: Secondary | ICD-10-CM | POA: Diagnosis not present

## 2020-06-13 NOTE — Progress Notes (Signed)
Annual Exam   Chief Complaint:  Chief Complaint  Patient presents with  . Vaginal Discharge  . std screening    History of Present Illness:  [name] is a 24 y.o. No obstetric history on file. who LMP was No LMP recorded. Patient has had an injection., presents today for her annual examination.    #Yeast infection concern - has had infections in the past - having creamy discharge - treated at last office visit   Nutrition/Lifestyle Diet: healthy eating, working on drinking more water Exercise: walking once a week She does get adequate calcium and Vitamin D in her diet.  Social History   Tobacco Use  Smoking Status Never Smoker  Smokeless Tobacco Never Used   Social History   Substance and Sexual Activity  Alcohol Use Yes   Comment: a few times a month, 2-3 drinks   Social History   Substance and Sexual Activity  Drug Use Yes  . Frequency: 2.0 times per week  . Types: Marijuana     Safety The patient wears seatbelts: yes.     The patient feels safe at home and in their relationships: yes.  General Health Dentist in the last year: No Eye doctor: no  Menstrual On depo, no periods  GYN She is single partner, contraception - Depo-Provera injections.  Hx of STDs: chlamydia She does want STI testing today.  GC/C screening recommended for sexually active women 2 and younger   Cervical Cancer Screening (Age 12-65) Last Pap:  August 2019 Results were: no abnormalities with HPV negative  Family History of Breast Cancer: no Family History of Ovarian Cancer: no    Weight Wt Readings from Last 3 Encounters:  06/13/20 175 lb 8 oz (79.6 kg)  03/10/20 174 lb (78.9 kg)  11/17/19 170 lb 12 oz (77.5 kg)   Patient has normal BMI  BMI Readings from Last 1 Encounters:  06/13/20 25.92 kg/m     Chronic disease screening Blood pressure monitoring:  BP Readings from Last 3 Encounters:  06/13/20 122/78  03/10/20 118/62  11/17/19 112/64      Lipid Monitoring: Indication for screening: age >45, obesity, diabetes, family hx, CV risk factors.  Lipid screening: Not Indicated  Lab Results  Component Value Date   CHOL 127 04/28/2018   HDL 50.30 04/28/2018   LDLCALC 68 04/28/2018   TRIG 48.0 04/28/2018   CHOLHDL 3 04/28/2018     Diabetes Screening: age >8, overweight, family hx, PCOS, hx of gestational diabetes, at risk ethnicity, elevated blood pressure >135/80.  Diabetes Screening screening: Not Indicated  No results found for: HGBA1C    Past Medical History:  Diagnosis Date  . ADHD (attention deficit hyperactivity disorder)    Formall evaluated by psychological services    Past Surgical History:  Procedure Laterality Date  . NO PAST SURGERIES      Prior to Admission medications   Medication Sig Start Date End Date Taking? Authorizing Provider  hydrocortisone-pramoxine [facility]) 2.5-1 % rectal cream Place rectally in the morning and at bedtime. 06/08/20  Yes Lesleigh Noe, MD  medroxyPROGESTERone (DEPO-PROVERA) 150 MG/ML injection Inject 150 mg into the muscle every 3 (three) months.   Yes [provider]    No Known Allergies  Gynecologic History: No LMP recorded. Patient has had an injection.  Obstetric History: No obstetric history on file.  Social History   Socioeconomic History  . Marital status: Single    Spouse name: Not on file  . Number of  children: Not on file  . Years of education: Not on file  . Highest education level: Not on file  Occupational History  . Not on file  Tobacco Use  . Smoking status: Never Smoker  . Smokeless tobacco: Never Used  Vaping Use  . Vaping Use: Never used  Substance and Sexual Activity  . Alcohol use: Yes    Comment: a few times a month, 2-3 drinks  . Drug use: Yes    Frequency: 2.0 times per week    Types: Marijuana  . Sexual activity: Yes    Birth control/protection: Injection, Condom    Comment: condoms everytime  Other Topics  Concern  . 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 Difficulty of Paying Living Expenses: Not on file  Food Insecurity:   . Worried About Charity fundraiser in the Last Year: Not on file  . Ran Out of Food in the Last Year: Not on file  Transportation Needs:   . Lack of Transportation (Medical): Not on file  . Lack of Transportation (Non-Medical): Not on file  Physical Activity:   . Days of Exercise per Week: Not on file  . Minutes of Exercise per Session: Not on file  Stress:   . Feeling of Stress : Not on file  Social Connections:   . Frequency of Communication with Friends and Family: Not on file  . Frequency of Social Gatherings with Friends and Family: Not on file  . Attends Religious Services: Not on file  . Active Member of Clubs or Organizations: Not on file  . Attends Archivist Meetings: Not on file  . Marital Status: Not on file  Intimate Partner Violence:   . Fear of Current or Ex-Partner: Not on file  . Emotionally Abused: Not on file  . Physically Abused: Not on file  . Sexually Abused: Not on file    Family History  Problem Relation Age of Onset  . Diabetes Mother   . Diabetes Maternal Grandmother   . Hypertension Maternal Grandmother   . Congestive Heart Failure Maternal Grandmother   . Diabetes Maternal Grandfather   . Hypertension Maternal Grandfather   . Stroke Maternal  Grandfather 60    Review of Systems  Constitutional: Negative for chills and fever.  HENT: Negative for congestion and sore throat.   Eyes: Negative for blurred vision and double vision.  Respiratory: Negative for shortness of breath.   Cardiovascular: Negative for chest pain.  Gastrointestinal: Negative for heartburn, nausea and vomiting.  Genitourinary: Negative.        Vaginal discharge  Musculoskeletal: Negative.  Negative for myalgias.  Skin: Negative for rash.  Neurological: Negative for dizziness and headaches.  Endo/Heme/Allergies: Does not bruise/bleed easily.  Psychiatric/Behavioral: Negative for depression. The  patient is not nervous/anxious.      Physical Exam BP 122/78   Pulse 83   Temp 97.9 F (36.6 C) (Temporal)   Ht 5' 9"  (1.753 m)   Wt 175 lb 8 oz (79.6 kg)   SpO2 98%   BMI 25.92 kg/m    BP Readings from Last 3 Encounters:  06/13/20 122/78  03/10/20 118/62  11/17/19 112/64    Wt Readings from Last 3 Encounters:  06/13/20 175 lb 8 oz (79.6 kg)  03/10/20 174 lb (78.9 kg)  11/17/19 170 lb 12 oz (77.5 kg)     Physical Exam Constitutional:      General: She is not in acute distress.    Appearance: She is well-developed. She is not diaphoretic.  HENT:     Head: Normocephalic and atraumatic.     Right Ear: External ear normal.     Left Ear: External ear normal.     Nose: Nose normal.  Eyes:     General: No scleral icterus.    Conjunctiva/sclera: Conjunctivae normal.  Cardiovascular:     Rate and Rhythm: Normal rate and regular rhythm.     Heart sounds: No murmur heard.   Pulmonary:     Effort: Pulmonary effort is normal. No respiratory distress.     Breath sounds: Normal breath sounds. No wheezing.  Abdominal:     General: Bowel sounds are normal. There is no distension.     Palpations: Abdomen is soft. There is no mass.     Tenderness: There is no abdominal tenderness. There is no guarding or rebound.  Musculoskeletal:        General:  Normal range of motion.     Cervical back: Neck supple.  Lymphadenopathy:     Cervical: No cervical adenopathy.  Skin:    General: Skin is warm and dry.     Capillary Refill: Capillary refill takes less than 2 seconds.  Neurological:     Mental Status: She is alert and oriented to person, place, and time.  Psychiatric:        Behavior: Behavior normal.       Results: Depression screen [facility] 2/9 11/17/2019 10/30/2018 04/28/2018  Decreased Interest 0 0 0  Down, Depressed, Hopeless 0 0 0  PHQ - 2 Score 0 0 0      Assessment: 24 y.o. No obstetric history on file. female here for routine annual examination.  Plan: Problem List Items Addressed This Visit    None    Visit Diagnoses    Routine screening for STI (sexually transmitted infection)    -  Primary   Relevant Orders   RPR   C. trachomatis/N. gonorrhoeae RNA   Screening for HIV (human immunodeficiency virus)       Relevant Orders   HIV Antibody (routine testing w rflx)   Annual physical exam       Encounter for hepatitis C screening test for low risk patient       Relevant Orders   Hepatitis C antibody   Vaginal discharge       Relevant Orders   WET PREP BY MOLECULAR PROBE       Screening: -- Blood pressure screen normal -- cholesterol screening: not due for screening -- Weight screening: normal -- Diabetes Screening: not due for screening -- Nutrition: encouraged healthy diet   Psych -- Depression screening (PHQ-9):    Safety -- tobacco screening: not using -- alcohol screening:  low-risk usage. -- no evidence of domestic violence or  intimate partner violence. -- STD screening: gonorrhea/chlamydia NAAT (Recommended for <24 years) collected  Cancer Screening -- pap smear not collected per ASCCP guidelines -- family history of breast cancer screening: done. not at high risk.   Immunizations Immunization History  Administered Date(s) Administered  . DTaP 05/11/1997, 07/12/1997, 09/13/1997,  07/07/1998, 05/27/2001  . HPV 9-valent 03/10/2015, 06/01/2015  . HPV Quadrivalent 09/27/2009, 03/10/2015, 06/01/2015  . Hepatitis A 11/18/2007, 09/27/2009  . Hepatitis B 05-Oct-1996, 03/31/1997, 12/07/1997  . HiB (PRP-OMP) 05/11/1997, 07/12/1997, 07/07/1998  . Hpv 10/03/2010  . IPV 05/11/1997, 07/12/1997, 03/21/1998, 05/27/2001  . Influenza, Seasonal, Injecte, Preservative Fre 10/03/2012  . Influenza,inj,Quad PF,6+ Mos 09/10/2017  . Influenza-Unspecified 09/27/2009, 06/03/2018  . MMR 03/21/1998, 05/27/2001  . Meningococcal Conjugate 09/27/2009, 03/10/2015  . Tdap 11/18/2007, 04/28/2018  . Varicella 03/21/1998, 11/18/2007    -- flu vaccine up to date -- TDAP q10 years up to date  -- HPV vaccination series (Age <26, shared decision 27-45): received -- Covid-19 Vaccine unknown  Encouraged regular vision and dental screening. Encouraged healthy exercise and diet.   Lesleigh Noe

## 2020-06-13 NOTE — Patient Instructions (Signed)
Preventive Care 21-23 Years Old, Female Preventive care refers to visits with your health care provider and lifestyle choices that can promote health and wellness. This includes:  A yearly physical exam. This may also be called an annual well check.  Regular dental visits and eye exams.  Immunizations.  Screening for certain conditions.  Healthy lifestyle choices, such as eating a healthy diet, getting regular exercise, not using drugs or products that contain nicotine and tobacco, and limiting alcohol use. What can I expect for my preventive care visit? Physical exam Your health care provider will check your:  Height and weight. This may be used to calculate body mass index (BMI), which tells if you are at a healthy weight.  Heart rate and blood pressure.  Skin for abnormal spots. Counseling Your health care provider may ask you questions about your:  Alcohol, tobacco, and drug use.  Emotional well-being.  Home and relationship well-being.  Sexual activity.  Eating habits.  Work and work environment.  Method of birth control.  Menstrual cycle.  Pregnancy history. What immunizations do I need?  Influenza (flu) vaccine  This is recommended every year. Tetanus, diphtheria, and pertussis (Tdap) vaccine  You may need a Td booster every 10 years. Varicella (chickenpox) vaccine  You may need this if you have not been vaccinated. Human papillomavirus (HPV) vaccine  If recommended by your health care provider, you may need three doses over 6 months. Measles, mumps, and rubella (MMR) vaccine  You may need at least one dose of MMR. You may also need a second dose. Meningococcal conjugate (MenACWY) vaccine  One dose is recommended if you are age 19-21 years and a first-year college student living in a residence hall, or if you have one of several medical conditions. You may also need additional booster doses. Pneumococcal conjugate (PCV13) vaccine  You may need  this if you have certain conditions and were not previously vaccinated. Pneumococcal polysaccharide (PPSV23) vaccine  You may need one or two doses if you smoke cigarettes or if you have certain conditions. Hepatitis A vaccine  You may need this if you have certain conditions or if you travel or work in places where you may be exposed to hepatitis A. Hepatitis B vaccine  You may need this if you have certain conditions or if you travel or work in places where you may be exposed to hepatitis B. Haemophilus influenzae type b (Hib) vaccine  You may need this if you have certain conditions. You may receive vaccines as individual doses or as more than one vaccine together in one shot (combination vaccines). Talk with your health care provider about the risks and benefits of combination vaccines. What tests do I need?  Blood tests  Lipid and cholesterol levels. These may be checked every 5 years starting at age 20.  Hepatitis C test.  Hepatitis B test. Screening  Diabetes screening. This is done by checking your blood sugar (glucose) after you have not eaten for a while (fasting).  Sexually transmitted disease (STD) testing.  BRCA-related cancer screening. This may be done if you have a family history of breast, ovarian, tubal, or peritoneal cancers.  Pelvic exam and Pap test. This may be done every 3 years starting at age 21. Starting at age 30, this may be done every 5 years if you have a Pap test in combination with an HPV test. Talk with your health care provider about your test results, treatment options, and if necessary, the need for more tests.   Follow these instructions at home: Eating and drinking   Eat a diet that includes fresh fruits and vegetables, whole grains, lean protein, and low-fat dairy.  Take vitamin and mineral supplements as recommended by your health care provider.  Do not drink alcohol if: ? Your health care provider tells you not to drink. ? You are  pregnant, may be pregnant, or are planning to become pregnant.  If you drink alcohol: ? Limit how much you have to 0-1 drink a day. ? Be aware of how much alcohol is in your drink. In the [place] one drink equals one 12 oz bottle of beer (355 mL), one 5 oz glass of wine (148 mL), or one 1 oz glass of hard liquor (44 mL). Lifestyle  Take daily care of your teeth and gums.  Stay active. Exercise for at least 30 minutes on 5 or more days each week.  Do not use any products that contain nicotine or tobacco, such as cigarettes, e-cigarettes, and chewing tobacco. If you need help quitting, ask your health care provider.  If you are sexually active, practice safe sex. Use a condom or other form of birth control (contraception) in order to prevent pregnancy and STIs (sexually transmitted infections). If you plan to become pregnant, see your health care provider for a preconception visit. What's next?  Visit your health care provider once a year for a well check visit.  Ask your health care provider how often you should have your eyes and teeth checked.  Stay up to date on all vaccines. This information is not intended to replace advice given to you by your health care provider. Make sure you discuss any questions you have with your health care provider. Document Revised: 05/01/2018 Document Reviewed: 05/01/2018 Elsevier Patient Education  2020 [employer].

## 2020-06-15 ENCOUNTER — Ambulatory Visit: Payer: Federal, State, Local not specified - PPO

## 2020-06-15 LAB — C. TRACHOMATIS/N. GONORRHOEAE RNA
C. trachomatis RNA, TMA: NOT DETECTED
N. gonorrhoeae RNA, TMA: NOT DETECTED

## 2020-06-15 LAB — WET PREP BY MOLECULAR PROBE
Candida species: NOT DETECTED
Gardnerella vaginalis: NOT DETECTED
MICRO NUMBER:: 11055855
SPECIMEN QUALITY:: ADEQUATE
Trichomonas vaginosis: NOT DETECTED

## 2020-06-15 LAB — HIV ANTIBODY (ROUTINE TESTING W REFLEX): HIV 1&2 Ab, 4th Generation: NONREACTIVE

## 2020-06-15 LAB — HEPATITIS C ANTIBODY
Hepatitis C Ab: NONREACTIVE
SIGNAL TO CUT-OFF: 0.01 (ref <1.00)

## 2020-06-15 LAB — RPR: RPR Ser Ql: NONREACTIVE

## 2020-08-24 ENCOUNTER — Telehealth: Payer: Self-pay

## 2020-08-24 ENCOUNTER — Ambulatory Visit (INDEPENDENT_AMBULATORY_CARE_PROVIDER_SITE_OTHER): Payer: Federal, State, Local not specified - PPO

## 2020-08-24 ENCOUNTER — Other Ambulatory Visit: Payer: Self-pay

## 2020-08-24 DIAGNOSIS — Z3042 Encounter for surveillance of injectable contraceptive: Secondary | ICD-10-CM | POA: Diagnosis not present

## 2020-08-24 MED ORDER — MEDROXYPROGESTERONE ACETATE 150 MG/ML IM SUSP
150.0000 mg | Freq: Once | INTRAMUSCULAR | Status: AC
  Administered 2020-08-24: 150 mg via INTRAMUSCULAR

## 2020-08-24 NOTE — Progress Notes (Signed)
Per orders of Dr. Selena Batten, injection of Depo Provera, given by Selina Cooley, RN. Patient tolerated injection well in LUOQ.

## 2020-08-24 NOTE — Telephone Encounter (Signed)
Patient did not show for her nurse visit this morning. Called and left voicemail for patient to call office back.

## 2020-11-09 ENCOUNTER — Ambulatory Visit (INDEPENDENT_AMBULATORY_CARE_PROVIDER_SITE_OTHER): Payer: Federal, State, Local not specified - PPO

## 2020-11-09 DIAGNOSIS — Z3042 Encounter for surveillance of injectable contraceptive: Secondary | ICD-10-CM

## 2020-11-09 MED ORDER — MEDROXYPROGESTERONE ACETATE 150 MG/ML IM SUSP
150.0000 mg | Freq: Once | INTRAMUSCULAR | Status: AC
  Administered 2020-11-09: 150 mg via INTRAMUSCULAR

## 2020-11-09 NOTE — Progress Notes (Signed)
Per orders of Dr. Selena Batten, patient given Depo-Provera injection to right ventrogluteal, given by Wendie Simmer, CMA. Patient voiced no concerns nor showed any signs of distress during injection.

## 2021-01-25 ENCOUNTER — Ambulatory Visit (INDEPENDENT_AMBULATORY_CARE_PROVIDER_SITE_OTHER): Payer: Federal, State, Local not specified - PPO

## 2021-01-25 ENCOUNTER — Other Ambulatory Visit: Payer: Self-pay

## 2021-01-25 DIAGNOSIS — Z3042 Encounter for surveillance of injectable contraceptive: Secondary | ICD-10-CM

## 2021-01-25 MED ORDER — MEDROXYPROGESTERONE ACETATE 150 MG/ML IM SUSP
150.0000 mg | Freq: Once | INTRAMUSCULAR | Status: AC
  Administered 2021-01-25: 150 mg via INTRAMUSCULAR

## 2021-01-25 NOTE — Progress Notes (Signed)
Per orders of Dr. Selena Batten, injection of Depo-Provera in Left Ventrogluteal, given by Erby Pian. Patient tolerated injection well. Next injection due between 8/10-8/24.

## 2021-03-08 ENCOUNTER — Telehealth: Payer: Self-pay

## 2021-03-08 ENCOUNTER — Encounter: Payer: Self-pay | Admitting: Family Medicine

## 2021-03-08 NOTE — Telephone Encounter (Signed)
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 Patient had CPE in October 2021. Please let patient know if she needs an office visit to discuss one reviewed. Thank you

## 2021-03-20 DIAGNOSIS — Z23 Encounter for immunization: Secondary | ICD-10-CM | POA: Diagnosis not present

## 2021-03-20 NOTE — Telephone Encounter (Signed)
Found Covid vaccine information in [place]. Form filled out and signed. Left record up front for patient to pick up. Sending mychart with this information also.

## 2021-03-20 NOTE — Telephone Encounter (Signed)
Left message for patient to call back in regards to the record. Also, sent mychart message. Paperwork need Covid vaccine information.

## 2021-04-12 ENCOUNTER — Ambulatory Visit: Payer: Federal, State, Local not specified - PPO

## 2021-04-19 ENCOUNTER — Ambulatory Visit (INDEPENDENT_AMBULATORY_CARE_PROVIDER_SITE_OTHER): Payer: Federal, State, Local not specified - PPO

## 2021-04-19 ENCOUNTER — Other Ambulatory Visit: Payer: Self-pay

## 2021-04-19 DIAGNOSIS — Z3042 Encounter for surveillance of injectable contraceptive: Secondary | ICD-10-CM

## 2021-04-19 MED ORDER — MEDROXYPROGESTERONE ACETATE 150 MG/ML IM SUSP
150.0000 mg | Freq: Once | INTRAMUSCULAR | Status: AC
  Administered 2021-04-19: 150 mg via INTRAMUSCULAR

## 2021-04-19 NOTE — Progress Notes (Signed)
Per orders of Dr. Gweneth Dimitri , injection of Depo  given by Donnamarie Poag in left upper outer quadrant . Patient tolerated injection well. Made appointment with patient during visit for 07/21/2021 for next depo. If any further questions she will let our office know.

## 2021-05-03 ENCOUNTER — Telehealth: Payer: Self-pay | Admitting: Family Medicine

## 2021-05-03 NOTE — Telephone Encounter (Signed)
Shelby Andrews called in wanted to know if she can get a referral for an ultrasound of the breast sent to [facility] due to the pain in her right breast

## 2021-05-03 NOTE — Telephone Encounter (Signed)
Called Ms. Kage  and got her schedule on 9/1 @ 9

## 2021-05-04 ENCOUNTER — Ambulatory Visit: Payer: Federal, State, Local not specified - PPO | Admitting: Family Medicine

## 2021-05-04 ENCOUNTER — Other Ambulatory Visit: Payer: Self-pay

## 2021-05-04 ENCOUNTER — Ambulatory Visit: Payer: Federal, State, Local not specified - PPO | Admitting: Nurse Practitioner

## 2021-05-04 VITALS — BP 124/85 | HR 83 | Temp 98.3°F | Ht 69.0 in | Wt 181.5 lb

## 2021-05-04 VITALS — BP 116/74

## 2021-05-04 DIAGNOSIS — N644 Mastodynia: Secondary | ICD-10-CM

## 2021-05-04 DIAGNOSIS — Z23 Encounter for immunization: Secondary | ICD-10-CM | POA: Diagnosis not present

## 2021-05-04 MED ORDER — IBUPROFEN 600 MG PO TABS: 600.0000 mg | ORAL_TABLET | Freq: Three times a day (TID) | ORAL | 0 refills | Status: AC | PRN

## 2021-05-04 NOTE — Progress Notes (Signed)
   Acute Office Visit  Subjective:    Patient ID: Shelby Andrews, female    DOB: 1996/03/15, 25 y.o.   MRN: [id number]   HPI 25 y.o. presents today for bilateral breast pain R > L. Right breast pain started 1 month ago, left started yesterday. Pain occurs multiple times per day, is intermittent, sharp/shooting, lasts 2-3 minutes, severe at times. Nothing makes it worse and nothing makes it better. Saw PCP this morning for same complaint with recommendations to continue NSAIDs and plan for breast ultrasound if pain persists for 2-3 weeks. Depo Provera x 7 years.    Review of Systems  Constitutional: Negative.   Right breast: Positive for pain. Negative for discharge, redness, swelling, lump Left breast: Positive for pain. Negative for discharge, redness, swelling, lump    Objective:    Physical Exam Constitutional:      Appearance: Normal appearance.  Chest:  Breasts:    Right: Tenderness (generalized) present. No swelling, inverted nipple, mass, nipple discharge or skin change.     Left: Tenderness (outer region) present. No swelling, inverted nipple, mass, nipple discharge or skin change.    BP 116/74  Wt Readings from Last 3 Encounters:  05/04/21 181 lb 8 oz (82.3 kg)  06/13/20 175 lb 8 oz (79.6 kg)  03/10/20 174 lb (78.9 kg)        Assessment & Plan:   Problem List Items Addressed This Visit   None Visit Diagnoses     Acute breast pain    -  Primary   Relevant Medications   ibuprofen (ADVIL) 600 MG tablet      Plan: Reassurance provided that breast pain likely hormonal in nature. She is very concerned with pain and would like to proceed with breast ultrasounds now instead of waiting 2-3 weeks. We will send referral. Ibuprofen 600 mg every 8 hours as needed for pain. She is agreeable to plan.      Olivia Mackie DNP, 4:20 PM 05/04/2021

## 2021-05-04 NOTE — Progress Notes (Signed)
Subjective:     Shelby Andrews is a 25 y.o. female presenting for Breast Pain (Started in R and is now having pain in L x 1 month )     HPI  #Bilateral breast pain - started 1 month ago - no bumps or lesion - no color change in the skin - does have a new mole - no nipple discharge - one depo - no cycles  - treatment: heating pad with relief, advil  - pain is surge like - stabbing - lasts for 2-3 minutes and then lateral edge with burst of pain  - achy sensation  Possible breast cancer hx in distant relatives  Review of Systems   Social History   Tobacco Use  Smoking Status Never  Smokeless Tobacco Never        Objective:    BP Readings from Last 3 Encounters:  05/04/21 124/85  06/13/20 122/78  03/10/20 118/62   Wt Readings from Last 3 Encounters:  05/04/21 181 lb 8 oz (82.3 kg)  06/13/20 175 lb 8 oz (79.6 kg)  03/10/20 174 lb (78.9 kg)    BP 124/85   Pulse 83   Temp 98.3 F (36.8 C) (Temporal)   Ht 5\' 9"  (1.753 m)   Wt 181 lb 8 oz (82.3 kg)   SpO2 98%   BMI 26.80 kg/m    Physical Exam Exam conducted with a chaperone present.  Constitutional:      General: She is not in acute distress.    Appearance: She is well-developed. She is not diaphoretic.  HENT:     Right Ear: External ear normal.     Left Ear: External ear normal.  Eyes:     Conjunctiva/sclera: Conjunctivae normal.  Cardiovascular:     Rate and Rhythm: Normal rate.  Pulmonary:     Effort: Pulmonary effort is normal.  Chest:  Breasts:    Breasts are symmetrical.     Right: Tenderness (on the lateral side of breast) present. No swelling, inverted nipple, mass or skin change.     Left: Tenderness (on the lateral side of breast) present. No swelling, inverted nipple, mass or skin change.  Musculoskeletal:     Cervical back: Neck supple.  Skin:    General: Skin is warm and dry.     Capillary Refill: Capillary refill takes less than 2 seconds.  Neurological:     Mental  Status: She is alert. Mental status is at baseline.  Psychiatric:        Mood and Affect: Mood normal.        Behavior: Behavior normal.          Assessment & Plan:   Problem List Items Addressed This Visit       Other   Breast pain, right - Primary    Pt with 1 month of symptoms. No change in birth control (on depo) or cycles. Discussed this is often benign and with normal exam trial of NSAIDs. Will order for f/u imaging if no improvement in 2-3 weeks. Discussed warning signs - erythema, swelling, lesions to return.       Relevant Orders   US BREAST LTD UNI RIGHT INC AXILLA   Breast pain, left   Relevant Orders   US BREAST LTD UNI LEFT INC AXILLA   Other Visit Diagnoses     Need for influenza vaccination       Relevant Orders   Flu Vaccine QUAD 78mo+IM (Fluarix, Fluzone & Alfiuria Quad PF) (  Completed)        Return if symptoms worsen or fail to improve.  Lynnda Child, MD  This visit occurred during the SARS-CoV-2 public health emergency.  Safety protocols were in place, including screening questions prior to the visit, additional usage of staff PPE, and extensive cleaning of exam room while observing appropriate contact time as indicated for disinfecting solutions.

## 2021-05-04 NOTE — Patient Instructions (Signed)
As we discussed your exam in reassuring   Would recommend Ibuprofen 600-800 mg 2-3 times a day for 7-14 days  If no improvement reasonable to get imaging to rule out more serious causes

## 2021-05-04 NOTE — Assessment & Plan Note (Signed)
Pt with 1 month of symptoms. No change in birth control (on depo) or cycles. Discussed this is often benign and with normal exam trial of NSAIDs. Will order [place] for f/u imaging if no improvement in 2-3 weeks. Discussed warning signs - erythema, swelling, lesions to return.

## 2021-05-05 ENCOUNTER — Other Ambulatory Visit: Payer: Self-pay | Admitting: Nurse Practitioner

## 2021-05-05 ENCOUNTER — Telehealth: Payer: Self-pay | Admitting: *Deleted

## 2021-05-05 DIAGNOSIS — N644 Mastodynia: Secondary | ICD-10-CM

## 2021-05-05 NOTE — Telephone Encounter (Signed)
-----   Message from Olivia Mackie, NP sent at 05/04/2021  3:57 PM EDT ----- Please send referral for bilateral breast ultrasound for breast pain. Thank you.

## 2021-05-05 NOTE — Telephone Encounter (Signed)
Order placed. [facility] contacted. They state they will call patient and set up appointment. I will wait on appointment date before I close encounter.

## 2021-05-16 NOTE — Telephone Encounter (Signed)
Appointment 05/30/2021

## 2021-05-30 ENCOUNTER — Other Ambulatory Visit: Payer: Self-pay

## 2021-05-30 ENCOUNTER — Ambulatory Visit
Admission: RE | Admit: 2021-05-30 | Discharge: 2021-05-30 | Disposition: A | Discharge status: Home / Self Care | Payer: Federal, State, Local not specified - PPO | Source: Ambulatory Visit | Attending: Family Medicine | Admitting: Family Medicine

## 2021-05-30 DIAGNOSIS — N644 Mastodynia: Secondary | ICD-10-CM | POA: Diagnosis not present

## 2021-06-14 ENCOUNTER — Other Ambulatory Visit (HOSPITAL_COMMUNITY)
Admission: RE | Admit: 2021-06-14 | Discharge: 2021-06-14 | Disposition: A | Discharge status: Home / Self Care | Payer: Federal, State, Local not specified - PPO | Source: Ambulatory Visit | Attending: Family Medicine | Admitting: Family Medicine

## 2021-06-14 ENCOUNTER — Ambulatory Visit: Payer: Federal, State, Local not specified - PPO | Admitting: Family Medicine

## 2021-06-14 ENCOUNTER — Other Ambulatory Visit: Payer: Self-pay

## 2021-06-14 ENCOUNTER — Encounter: Payer: Self-pay | Admitting: Family Medicine

## 2021-06-14 VITALS — BP 120/90 | HR 97 | Temp 97.0°F | Ht 68.0 in | Wt 183.4 lb

## 2021-06-14 DIAGNOSIS — Z113 Encounter for screening for infections with a predominantly sexual mode of transmission: Secondary | ICD-10-CM

## 2021-06-14 DIAGNOSIS — Z13220 Encounter for screening for lipoid disorders: Secondary | ICD-10-CM

## 2021-06-14 DIAGNOSIS — Z1159 Encounter for screening for other viral diseases: Secondary | ICD-10-CM

## 2021-06-14 DIAGNOSIS — Z0000 Encounter for general adult medical examination without abnormal findings: Secondary | ICD-10-CM | POA: Diagnosis not present

## 2021-06-14 DIAGNOSIS — Z114 Encounter for screening for human immunodeficiency virus [HIV]: Secondary | ICD-10-CM | POA: Diagnosis not present

## 2021-06-14 DIAGNOSIS — Z124 Encounter for screening for malignant neoplasm of cervix: Secondary | ICD-10-CM | POA: Insufficient documentation

## 2021-06-14 DIAGNOSIS — K649 Unspecified hemorrhoids: Secondary | ICD-10-CM

## 2021-06-14 DIAGNOSIS — Z118 Encounter for screening for other infectious and parasitic diseases: Secondary | ICD-10-CM | POA: Diagnosis not present

## 2021-06-14 LAB — COMPREHENSIVE METABOLIC PANEL
ALT: 16 U/L (ref 0–35)
AST: 20 U/L (ref 0–37)
Albumin: 4.4 g/dL (ref 3.5–5.2)
Alkaline Phosphatase: 51 U/L (ref 39–117)
BUN: 8 mg/dL (ref 6–23)
CO2: 23 mEq/L (ref 19–32)
Calcium: 9.7 mg/dL (ref 8.4–10.5)
Chloride: 108 mEq/L (ref 96–112)
Creatinine, Ser: 0.86 mg/dL (ref 0.40–1.20)
GFR: 94.64 mL/min (ref >60.00)
Glucose, Bld: 93 mg/dL (ref 70–99)
Potassium: 4.2 mEq/L (ref 3.5–5.1)
Sodium: 138 mEq/L (ref 135–145)
Total Bilirubin: 0.6 mg/dL (ref 0.2–1.2)
Total Protein: 7.2 g/dL (ref 6.0–8.3)

## 2021-06-14 LAB — LIPID PANEL
Cholesterol: 156 mg/dL (ref 0–200)
HDL: 53.3 mg/dL (ref >39.00)
LDL Cholesterol: 92 mg/dL (ref 0–99)
NonHDL: 102.69
Total CHOL/HDL Ratio: 3
Triglycerides: 54 mg/dL (ref 0.0–149.0)
VLDL: 10.8 mg/dL (ref 0.0–40.0)

## 2021-06-14 MED ORDER — HYDROCORT-PRAMOXINE (PERIANAL) 2.5-1 % EX CREA: TOPICAL_CREAM | Freq: Two times a day (BID) | CUTANEOUS | 0 refills | Status: DC

## 2021-06-14 NOTE — Patient Instructions (Signed)
Preventive Care 21-24 Years Old, Female Preventive care refers to lifestyle choices and visits with your health care provider that can promote health and wellness. This includes: A yearly physical exam. This is also called an annual wellness visit. Regular dental and eye exams. Immunizations. Screening for certain conditions. Healthy lifestyle choices, such as: Eating a healthy diet. Getting regular exercise. Not using drugs or products that contain nicotine and tobacco. Limiting alcohol use. What can I expect for my preventive care visit? Physical exam Your health care provider may check your: Height and weight. These may be used to calculate your BMI (body mass index). BMI is a measurement that tells if you are at a healthy weight. Heart rate and blood pressure. Body temperature. Skin for abnormal spots. Counseling Your health care provider may ask you questions about your: Past medical problems. Family's medical history. Alcohol, tobacco, and drug use. Emotional well-being. Home life and relationship well-being. Sexual activity. Diet, exercise, and sleep habits. Work and work environment. Access to firearms. Method of birth control. Menstrual cycle. Pregnancy history. What immunizations do I need? Vaccines are usually given at various ages, according to a schedule. Your health care provider will recommend vaccines for you based on your age, medical history, and lifestyle or other factors, such as travel or where you work. What tests do I need? Blood tests Lipid and cholesterol levels. These may be checked every 5 years starting at age 20. Hepatitis C test. Hepatitis B test. Screening Diabetes screening. This is done by checking your blood sugar (glucose) after you have not eaten for a while (fasting). STD (sexually transmitted disease) testing, if you are at risk. BRCA-related cancer screening. This may be done if you have a family history of breast, ovarian, tubal, or  peritoneal cancers. Pelvic exam and Pap test. This may be done every 3 years starting at age 21. Starting at age 30, this may be done every 5 years if you have a Pap test in combination with an HPV test. Talk with your health care provider about your test results, treatment options, and if necessary, the need for more tests. Follow these instructions at home: Eating and drinking  Eat a healthy diet that includes fresh fruits and vegetables, whole grains, lean protein, and low-fat dairy products. Take vitamin and mineral supplements as recommended by your health care provider. Do not drink alcohol if: Your health care provider tells you not to drink. You are pregnant, may be pregnant, or are planning to become pregnant. If you drink alcohol: Limit how much you have to 0-1 drink a day. Be aware of how much alcohol is in your drink. In the [place] one drink equals one 12 oz bottle of beer (355 mL), one 5 oz glass of wine (148 mL), or one 1 oz glass of hard liquor (44 mL). Lifestyle Take daily care of your teeth and gums. Brush your teeth every morning and night with fluoride toothpaste. Floss one time each day. Stay active. Exercise for at least 30 minutes 5 or more days each week. Do not use any products that contain nicotine or tobacco, such as cigarettes, e-cigarettes, and chewing tobacco. If you need help quitting, ask your health care provider. Do not use drugs. If you are sexually active, practice safe sex. Use a condom or other form of protection to prevent STIs (sexually transmitted infections). If you do not wish to become pregnant, use a form of birth control. If you plan to become pregnant, see your health care provider   for a prepregnancy visit. Find healthy ways to cope with stress, such as: Meditation, yoga, or listening to music. Journaling. Talking to a trusted person. Spending time with friends and family. Safety Always wear your seat belt while driving or riding in a  vehicle. Do not drive: If you have been drinking alcohol. Do not ride with someone who has been drinking. When you are tired or distracted. While texting. Wear a helmet and other protective equipment during sports activities. If you have firearms in your house, make sure you follow all gun safety procedures. Seek help if you have been physically or sexually abused. What's next? Go to your health care provider once a year for an annual wellness visit. Ask your health care provider how often you should have your eyes and teeth checked. Stay up to date on all vaccines. This information is not intended to replace advice given to you by your health care provider. Make sure you discuss any questions you have with your health care provider. Document Revised: 10/28/2020 Document Reviewed: 05/01/2018 Elsevier Patient Education  2022 Elsevier Inc.  

## 2021-06-14 NOTE — Progress Notes (Signed)
Annual Exam   Chief Complaint:  Chief Complaint  Patient presents with   Annual Exam   Gynecologic Exam    History of Present Illness:  [name] is a 25 y.o. No obstetric history on file. who LMP was No LMP recorded. Patient has had an injection., presents today for her annual examination.      Nutrition/Lifestyle Diet: inconsistent - grad school so limited time, tries to eat healthy Exercise: not currently She does get adequate calcium and Vitamin D in her diet.  Social History   Tobacco Use  Smoking Status Never  Smokeless Tobacco Never   Social History   Substance and Sexual Activity  Alcohol Use Yes   Comment: a few times a month, 2-3 drinks   Social History   Substance and Sexual Activity  Drug Use Yes   Frequency: 2.0 times per week   Types: Marijuana     Safety The patient wears seatbelts: yes.     The patient feels safe at home and in their relationships: yes.  General Health Dentist in the last year: Yes Eye doctor: yes  Menstrual On Depo - no cycles  GYN She is single partner, contraception - depo.  Hx of STDs: none She does want STI testing today.  GC/C screening recommended for sexually active women 39 and younger   Cervical Cancer Screening (Age 64-65) Last Pap:  August 2019 Results were: atypical squamous cellularity of undetermined significance (ASCUS) with HPV negative  Family History of Breast Cancer: no Family History of Ovarian Cancer: no    Weight Wt Readings from Last 3 Encounters:  06/14/21 183 lb 6 oz (83.2 kg)  05/04/21 181 lb 8 oz (82.3 kg)  06/13/20 175 lb 8 oz (79.6 kg)   Patient has high BMI  BMI Readings from Last 1 Encounters:  06/14/21 27.88 kg/m     Chronic disease screening Blood pressure monitoring:  BP Readings from Last 3 Encounters:  06/14/21 120/90  05/04/21 116/74  05/04/21 124/85     Lipid Monitoring: Indication for screening: age >24, obesity, diabetes, family hx, CV risk  factors.  Lipid screening: Yes  Lab Results  Component Value Date   CHOL 127 04/28/2018   HDL 50.30 04/28/2018   LDLCALC 68 04/28/2018   TRIG 48.0 04/28/2018   CHOLHDL 3 04/28/2018     Diabetes Screening: age >38, overweight, family hx, PCOS, hx of gestational diabetes, at risk ethnicity, elevated blood pressure >135/80.  Diabetes Screening screening: Yes  No results found for: HGBA1C    Past Medical History:  Diagnosis Date   ADHD (attention deficit hyperactivity disorder)    Formall evaluated by psychological services    Past Surgical History:  Procedure Laterality Date   NO PAST SURGERIES      Prior to Admission medications   Medication Sig Start Date End Date Taking? Authorizing Provider  hydrocortisone-pramoxine [facility]) 2.5-1 % rectal cream Place rectally in the morning and at bedtime. 06/08/20  Yes Lesleigh Noe, MD  ibuprofen (ADVIL) 600 MG tablet Take 1 tablet (600 mg total) by mouth every 8 (eight) hours as needed. 05/04/21  Yes Marny Lowenstein A, NP  medroxyPROGESTERone (DEPO-PROVERA) 150 MG/ML injection Inject 150 mg into the muscle every 3 (three) months.   Yes [provider]    No Known Allergies  Gynecologic History: No LMP recorded. Patient has had an injection.  Obstetric History: No obstetric history on file.  Social History   Socioeconomic History   Marital status: Single  Spouse name: Not on file   Number of children: Not on file   Years of education: Not on file   Highest education level: Not on file  Occupational History   Not on file  Tobacco Use   Smoking status: Never   Smokeless tobacco: Never  Vaping Use   Vaping Use: Never used  Substance and Sexual Activity   Alcohol use: Yes    Comment: a few times a month, 2-3 drinks   Drug use: Yes    Frequency: 2.0 times per week    Types: Marijuana   Sexual activity: Yes    Birth control/protection: Injection, Condom    Comment: condoms everytime  Other Topics  Concern   Not on file  Social History Narrative   11/17/19   From: [place]   Living: at home, with parents   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 [employer]      Family: gets along well with family - oldest of 3 siblings      Enjoys: sleep, spend time with family/friends      Exercise: walking more with mom - walking around [place] [employer]   Diet: enjoys fish, natural juice (aloe, coconut water), popcorn      Safety   Seat belts: Yes    Guns: No   Safe in relationships: Yes    Social Determinants of Radio broadcast assistant Strain: Not on file  Food Insecurity: Not on file  Transportation Needs: Not on file  Physical Activity: Not on file  Stress: Not on file  Social Connections: Not on file  Intimate Partner Violence: Not on file    Family History  Problem Relation Age of Onset   Diabetes Mother    Diabetes Maternal Grandmother    Hypertension Maternal Grandmother    Congestive Heart Failure Maternal Grandmother    Diabetes Maternal Grandfather    Hypertension Maternal Grandfather    Stroke Maternal Grandfather 60    Review of Systems  Constitutional:  Negative for chills and fever.  HENT:  Negative for congestion and sore throat.   Eyes:  Negative for blurred vision and double vision.  Respiratory:  Negative for shortness of breath.   Cardiovascular:  Negative for chest pain.  Gastrointestinal:  Negative for heartburn, nausea and vomiting.  Genitourinary: Negative.        Vaginal discharge and odor  Musculoskeletal: Negative.  Negative for myalgias.  Skin:  Negative for rash.  Neurological:  Negative for dizziness and headaches.  Endo/Heme/Allergies:  Does not bruise/bleed easily.  Psychiatric/Behavioral:  Negative for depression. The patient is not nervous/anxious.     Physical Exam BP 120/90   Pulse 97   Temp (!) 97 F (36.1 C) (Temporal)   Ht 5' 8" (1.727 m)   Wt 183 lb 6  oz (83.2 kg)   SpO2 98%   BMI 27.88 kg/m    BP Readings from Last 3 Encounters:  06/14/21 120/90  05/04/21 116/74  05/04/21 124/85    Wt Readings from Last 3 Encounters:  06/14/21 183 lb 6 oz (83.2 kg)  05/04/21 181 lb 8 oz (82.3 kg)  06/13/20 175 lb 8 oz (79.6 kg)     Physical Exam Exam conducted with a chaperone present.  Constitutional:      General: She is not in acute distress.    Appearance: She is well-developed. She is not diaphoretic.  HENT:  Head: Normocephalic and atraumatic.     Right Ear: External ear normal.     Left Ear: External ear normal.     Nose: Nose normal.  Eyes:     General: No scleral icterus.    Extraocular Movements: Extraocular movements intact.     Conjunctiva/sclera: Conjunctivae normal.  Cardiovascular:     Rate and Rhythm: Normal rate and regular rhythm.     Heart sounds: No murmur heard. Pulmonary:     Effort: Pulmonary effort is normal. No respiratory distress.     Breath sounds: Normal breath sounds. No wheezing.  Abdominal:     General: Bowel sounds are normal. There is no distension.     Palpations: Abdomen is soft. There is no mass.     Tenderness: There is no abdominal tenderness. There is no guarding or rebound.  Genitourinary:    Vagina: Normal.     Cervix: Erythema (at the os with spotting with collection) present.  Musculoskeletal:        General: Normal range of motion.     Cervical back: Neck supple.  Lymphadenopathy:     Cervical: No cervical adenopathy.  Skin:    General: Skin is warm and dry.     Capillary Refill: Capillary refill takes less than 2 seconds.  Neurological:     Mental Status: She is alert and oriented to person, place, and time.     Deep Tendon Reflexes: Reflexes normal.  Psychiatric:        Mood and Affect: Mood normal.        Behavior: Behavior normal.      Results: Depression screen [facility] 2/9 06/14/2021 11/17/2019 10/30/2018  Decreased Interest 0 0 0  Down, Depressed, Hopeless 0 0 0   PHQ - 2 Score 0 0 0      Assessment: 25 y.o. No obstetric history on file. female here for routine annual examination.  Plan: Problem List Items Addressed This Visit   None Visit Diagnoses     Annual physical exam    -  Primary   Relevant Orders   Comprehensive metabolic panel   Lipid panel   Hemorrhoids, unspecified hemorrhoid type       Relevant Medications   hydrocortisone-pramoxine (ANALPRAM-HC) 2.5-1 % rectal cream   Screening for chlamydial disease       Relevant Orders   Cytology - Pap, HPV if ASC-US/ASC/AGC+ 16/18 genotyping if +HPV (reflex) w GC/Chlamydia ([facility])   Cervical cancer screening       Relevant Orders   Cytology - Pap, HPV if ASC-US/ASC/AGC+ 16/18 genotyping if +HPV (reflex) w GC/Chlamydia ([facility])   Screening for HIV (human immunodeficiency virus)       Relevant Orders   HIV Antibody (routine testing w rflx)   Encounter for hepatitis C screening test for low risk patient       Relevant Orders   Hepatitis C antibody   Routine screening for STI (sexually transmitted infection)       Relevant Orders   RPR   Screening for hyperlipidemia       Relevant Orders   Lipid panel        Screening: -- Blood pressure screen normal -- cholesterol screening: will obtain -- Weight screening: overweight: continue to monitor -- Diabetes Screening: will obtain -- Nutrition: encouraged healthy diet   Psych -- Depression screening (PHQ-9):  Depression screen [facility] 2/9 06/14/2021 11/17/2019 10/30/2018  Decreased Interest 0 0 0  Down, Depressed, Hopeless 0 0 0  PHQ -  2 Score 0 0 0      Safety -- tobacco screening: not using -- alcohol screening:  low-risk usage. -- no evidence of domestic violence or intimate partner violence. -- STD screening: gonorrhea/chlamydia NAAT (Recommended for <24 years) collected  Cancer Screening -- pap smear collected per ASCCP guidelines -- family history of breast cancer screening: done. not at high  risk.   Immunizations Immunization History  Administered Date(s) Administered   DTaP 05/11/1997, 07/12/1997, 09/13/1997, 07/07/1998, 05/27/2001   HPV 9-valent 03/10/2015, 06/01/2015   HPV Quadrivalent 09/27/2009, 03/10/2015, 06/01/2015   Hepatitis A 11/18/2007, 09/27/2009   Hepatitis B 1996/10/31, 03/31/1997, 12/07/1997   HiB (PRP-OMP) 05/11/1997, 07/12/1997, 07/07/1998   Hpv-Unspecified 10/03/2010   IPV 05/11/1997, 07/12/1997, 03/21/1998, 05/27/2001   Influenza, Seasonal, Injecte, Preservative Fre 10/03/2012   Influenza,inj,Quad PF,6+ Mos 09/10/2017, 06/13/2020, 05/04/2021   Influenza-Unspecified 09/27/2009, 06/03/2018   MMR 03/21/1998, 05/27/2001   Meningococcal Conjugate 09/27/2009, 03/10/2015   PFIZER(Purple Top)SARS-COV-2 Vaccination 12/10/2019, 01/04/2020, 03/20/2021   Tdap 11/18/2007, 04/28/2018   Varicella 03/21/1998, 11/18/2007    -- flu vaccine up to date -- TDAP q10 years up to date ---- HPV vaccination series (Age <26, shared decision 27-45): received -- Covid-19 Vaccine up to date  Encouraged regular vision and dental screening. Encouraged healthy exercise and diet.   Lesleigh Noe

## 2021-06-15 LAB — HEPATITIS C ANTIBODY
Hepatitis C Ab: NONREACTIVE
SIGNAL TO CUT-OFF: 0.05 (ref <1.00)

## 2021-06-15 LAB — RPR: RPR Ser Ql: NONREACTIVE

## 2021-06-15 LAB — HIV ANTIBODY (ROUTINE TESTING W REFLEX): HIV 1&2 Ab, 4th Generation: NONREACTIVE

## 2021-06-21 LAB — CYTOLOGY - PAP
Chlamydia: NEGATIVE
Comment: NEGATIVE
Comment: NEGATIVE
Comment: NEGATIVE
Comment: NORMAL
Diagnosis: UNDETERMINED — AB
High risk HPV: NEGATIVE
Neisseria Gonorrhea: NEGATIVE
Trichomonas: NEGATIVE

## 2021-07-11 ENCOUNTER — Ambulatory Visit: Payer: Federal, State, Local not specified - PPO

## 2021-07-12 ENCOUNTER — Other Ambulatory Visit: Payer: Self-pay

## 2021-07-12 ENCOUNTER — Telehealth (INDEPENDENT_AMBULATORY_CARE_PROVIDER_SITE_OTHER): Payer: Federal, State, Local not specified - PPO | Admitting: Family Medicine

## 2021-07-12 ENCOUNTER — Encounter: Payer: Self-pay | Admitting: Family Medicine

## 2021-07-12 ENCOUNTER — Other Ambulatory Visit: Payer: Self-pay | Admitting: Family Medicine

## 2021-07-12 DIAGNOSIS — J111 Influenza due to unidentified influenza virus with other respiratory manifestations: Secondary | ICD-10-CM

## 2021-07-12 LAB — POCT INFLUENZA A/B
Influenza A, POC: NEGATIVE
Influenza B, POC: NEGATIVE

## 2021-07-12 MED ORDER — ONDANSETRON HCL 4 MG PO TABS: 4.0000 mg | ORAL_TABLET | Freq: Three times a day (TID) | ORAL | 0 refills | Status: DC | PRN

## 2021-07-12 NOTE — Progress Notes (Signed)
I connected with Shelby Andrews on 07/12/21 at  8:40 AM EST by video and verified that I am speaking with the correct person using two identifiers.   I discussed the limitations, risks, security and privacy concerns of performing an evaluation and management service by video and the availability of in person appointments. I also discussed with the patient that there may be a patient responsible charge related to this service. The patient expressed understanding and agreed to proceed.  Patient location: Home Provider Location: [facility] Participants: Lynnda Child and Shelby Andrews   Subjective:     Shelby Andrews is a 25 y.o. female presenting for Sore Throat (All symptoms started on 07/10/21. Tested negative for covid yesterday evening. ), Nasal Congestion, Generalized Body Aches, Emesis, and Chills     Sore Throat  This is a new problem. Episode onset: 07/10/2021. Associated symptoms include abdominal pain (cramping), congestion, coughing, ear pain, neck pain and vomiting. Pertinent negatives include no diarrhea or shortness of breath.  Emesis  Associated symptoms include abdominal pain (cramping), arthralgias, chills, coughing, a fever (hot but no thermometer) and myalgias. Pertinent negatives include no diarrhea.   Negative covid Sick contact - someone has been out of school  Treatment: soup, staying home from school - resting, drinking tea, emergency-C  Review of Systems  Constitutional:  Positive for chills, diaphoresis and fever (hot but no thermometer).  HENT:  Positive for congestion, ear pain and sore throat. Negative for sinus pressure and sinus pain.   Respiratory:  Positive for cough. Negative for shortness of breath.   Gastrointestinal:  Positive for abdominal pain (cramping), nausea and vomiting. Negative for diarrhea.  Musculoskeletal:  Positive for arthralgias, myalgias and neck pain.    Social History   Tobacco Use  Smoking Status Never   Smokeless Tobacco Never        Objective:   BP Readings from Last 3 Encounters:  06/14/21 120/90  05/04/21 116/74  05/04/21 124/85   Wt Readings from Last 3 Encounters:  06/14/21 183 lb 6 oz (83.2 kg)  05/04/21 181 lb 8 oz (82.3 kg)  06/13/20 175 lb 8 oz (79.6 kg)    There were no vitals taken for this visit.   Physical Exam Constitutional:      Appearance: Normal appearance. She is not ill-appearing.     Comments: Appears tired  HENT:     Head: Normocephalic and atraumatic.     Right Ear: External ear normal.     Left Ear: External ear normal.  Eyes:     Conjunctiva/sclera: Conjunctivae normal.  Pulmonary:     Effort: Pulmonary effort is normal. No respiratory distress.  Neurological:     Mental Status: She is alert. Mental status is at baseline.  Psychiatric:        Mood and Affect: Mood normal.        Behavior: Behavior normal.        Thought Content: Thought content normal.        Judgment: Judgment normal.        Assessment & Plan:   Problem List Items Addressed This Visit   None Visit Diagnoses     Flu syndrome          Given onset, suspect influenza Discussed symptomatic care Zofran for nausea Negative covid Drive up flu testing today W/in 48 hours so reasonable to treat if positive flu - Tamiflu available in liquid and capsule at [facility]  Return if symptoms  worsen or fail to improve.  Lesleigh Noe, MD

## 2021-07-12 NOTE — Progress Notes (Signed)
Virtual visit, testing for flu

## 2021-07-13 ENCOUNTER — Ambulatory Visit: Payer: Federal, State, Local not specified - PPO

## 2021-07-19 ENCOUNTER — Other Ambulatory Visit: Payer: Self-pay

## 2021-07-19 ENCOUNTER — Ambulatory Visit (INDEPENDENT_AMBULATORY_CARE_PROVIDER_SITE_OTHER): Payer: Federal, State, Local not specified - PPO

## 2021-07-19 DIAGNOSIS — Z3042 Encounter for surveillance of injectable contraceptive: Secondary | ICD-10-CM | POA: Diagnosis not present

## 2021-07-19 MED ORDER — MEDROXYPROGESTERONE ACETATE 150 MG/ML IM SUSP
150.0000 mg | Freq: Once | INTRAMUSCULAR | Status: AC
  Administered 2021-07-19: 150 mg via INTRAMUSCULAR

## 2021-07-19 NOTE — Progress Notes (Signed)
Per orders of Vernona Rieger in leu of Dr. Elmyra Ricks absence, an injection of Depo-Provera was given by Nyra Capes, CMA in the Right upper outer quadrant. Patient tolerated injection well.

## 2021-10-26 IMAGING — US US BREAST*R* LIMITED INC AXILLA
1 series · 3 of 3 positions shown · non-contrast
Comparison: None.

CLINICAL DATA: 25-year-old female with intermittent right breast
pain.

EXAM:
ULTRASOUND OF THE RIGHT BREAST

[Series 1: us breast*right* limited inc axilla · 0.06mm/px · 3 of 3 slices shown]
[im 1/3]
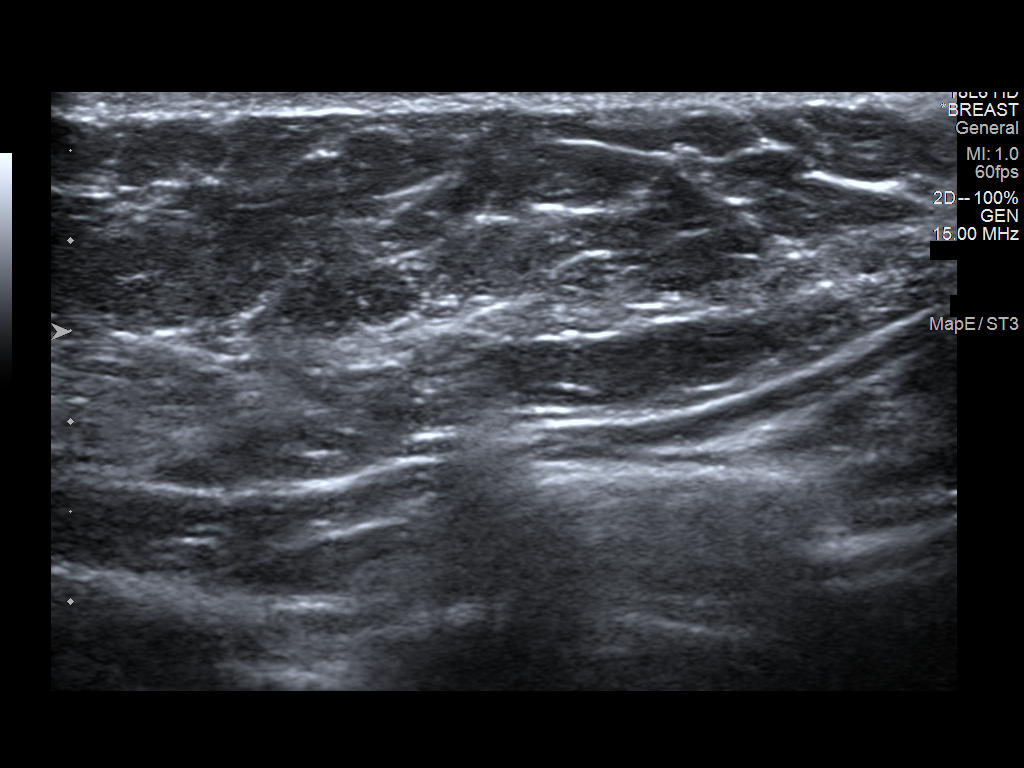
[im 2/3]
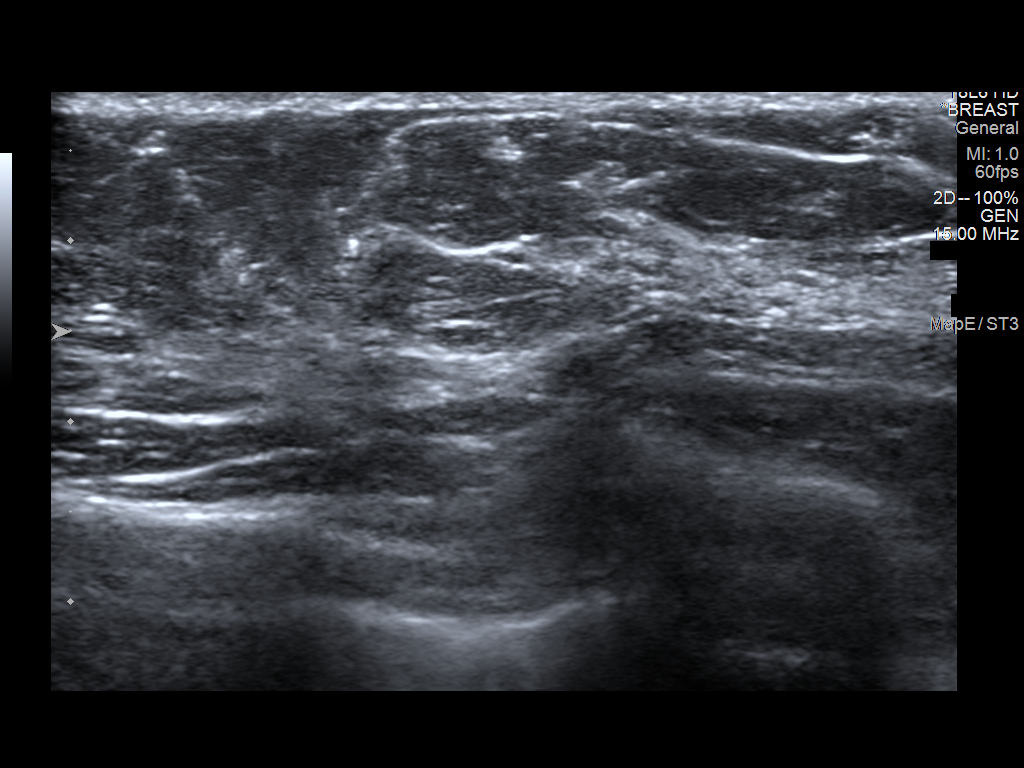
[im 3/3]
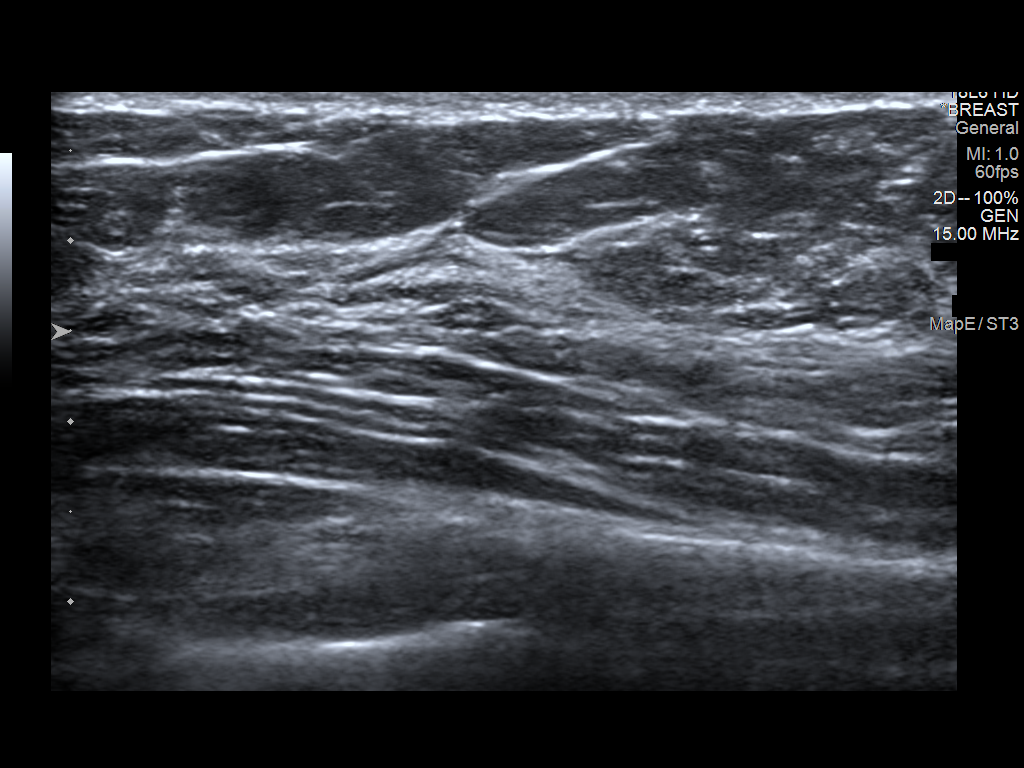

[3 of 3 positions shown; findings below may reference images not displayed]

FINDINGS: Targeted ultrasound is performed, showing normal fibroglandular
tissue without focal or suspicious sonographic abnormality.
IMPRESSION: Unremarkable ultrasound evaluation of the right breast.

RECOMMENDATION:
1. Clinical follow-up recommended for the painful area of concern in
the right breast. Any further workup should be based on clinical
grounds.
2. Screening mammogram at age 40 unless there are persistent or
intervening clinical concerns. (Code:[id number])

I have discussed the findings and recommendations with the patient.
If applicable, a reminder letter will be sent to the patient
regarding the next appointment.

BI-RADS CATEGORY  1: Negative.

## 2022-02-26 ENCOUNTER — Telehealth: Payer: Self-pay

## 2022-03-01 ENCOUNTER — Encounter: Payer: Self-pay | Admitting: Family Medicine

## 2022-03-01 ENCOUNTER — Ambulatory Visit (INDEPENDENT_AMBULATORY_CARE_PROVIDER_SITE_OTHER): Payer: Federal, State, Local not specified - PPO | Admitting: Family Medicine

## 2022-03-01 VITALS — BP 100/62 | HR 97 | Temp 97.4°F | Ht 68.75 in | Wt 183.0 lb

## 2022-03-01 DIAGNOSIS — Z789 Other specified health status: Secondary | ICD-10-CM | POA: Diagnosis not present

## 2022-03-01 DIAGNOSIS — Z113 Encounter for screening for infections with a predominantly sexual mode of transmission: Secondary | ICD-10-CM | POA: Diagnosis not present

## 2022-03-01 LAB — POCT URINE PREGNANCY: Preg Test, Ur: NEGATIVE

## 2022-03-01 NOTE — Assessment & Plan Note (Signed)
Missed dose. Urine pregnancy negative. 2 partners. Return in 2 weeks for nurse visit and repeat urine pregnancy and will plan for depo at that time. Cont condom use.

## 2022-03-01 NOTE — Progress Notes (Signed)
   Subjective:     Shelby Andrews is a 26 y.o. female presenting for Advice Only (And std testing, with no known exposure)     HPI  #Sexual activity - using condoms - 2 partners since her last depo shot - just got her first cycle since stopping depo -    Review of Systems   Social History   Tobacco Use  Smoking Status Never  Smokeless Tobacco Never        Objective:    BP Readings from Last 3 Encounters:  03/01/22 100/62  06/14/21 120/90  05/04/21 116/74   Wt Readings from Last 3 Encounters:  03/01/22 183 lb (83 kg)  06/14/21 183 lb 6 oz (83.2 kg)  05/04/21 181 lb 8 oz (82.3 kg)    BP 100/62   Pulse 97   Temp (!) 97.4 F (36.3 C) (Temporal)   Ht 5' 8.75" (1.746 m)   Wt 183 lb (83 kg)   SpO2 100%   BMI 27.22 kg/m    Physical Exam Constitutional:      General: She is not in acute distress.    Appearance: She is well-developed. She is not diaphoretic.  HENT:     Right Ear: External ear normal.     Left Ear: External ear normal.     Nose: Nose normal.  Eyes:     Conjunctiva/sclera: Conjunctivae normal.  Cardiovascular:     Rate and Rhythm: Normal rate.  Pulmonary:     Effort: Pulmonary effort is normal.  Musculoskeletal:     Cervical back: Neck supple.  Skin:    General: Skin is warm and dry.     Capillary Refill: Capillary refill takes less than 2 seconds.  Neurological:     Mental Status: She is alert. Mental status is at baseline.  Psychiatric:        Mood and Affect: Mood normal.        Behavior: Behavior normal.           Assessment & Plan:   Problem List Items Addressed This Visit       Other   Uses Depo-Provera as primary birth control method    Missed dose. Urine pregnancy negative. 2 partners. Return in 2 weeks for nurse visit and repeat urine pregnancy and will plan for depo at that time. Cont condom use.       Relevant Orders   POCT urine pregnancy (Completed)   Other Visit Diagnoses     Routine screening for  STI (sexually transmitted infection)    -  Primary   Relevant Orders   HSV 1/2 Ab (IgM), IFA w/rflx Titer   HIV Antibody (routine testing w rflx)   RPR   C. trachomatis/N. gonorrhoeae RNA   Hepatitis C antibody   HSV(herpes simplex vrs) 1+2 ab-IgG        Return if symptoms worsen or fail to improve.  Lynnda Child, MD

## 2022-03-02 LAB — RPR: RPR Ser Ql: NONREACTIVE

## 2022-03-02 LAB — HSV 1/2 AB (IGM), IFA W/RFLX TITER

## 2022-03-02 LAB — HIV ANTIBODY (ROUTINE TESTING W REFLEX): HIV 1&2 Ab, 4th Generation: NONREACTIVE

## 2022-03-02 LAB — HSV(HERPES SIMPLEX VRS) I + II AB-IGG
HAV 1 IGG,TYPE SPECIFIC AB: <0.9 index
HSV 2 IGG,TYPE SPECIFIC AB: <0.9 index

## 2022-03-02 LAB — C. TRACHOMATIS/N. GONORRHOEAE RNA
C. trachomatis RNA, TMA: NOT DETECTED
N. gonorrhoeae RNA, TMA: NOT DETECTED

## 2022-03-02 LAB — HEPATITIS C ANTIBODY: Hepatitis C Ab: NONREACTIVE

## 2022-03-15 ENCOUNTER — Ambulatory Visit (INDEPENDENT_AMBULATORY_CARE_PROVIDER_SITE_OTHER): Payer: Federal, State, Local not specified - PPO

## 2022-03-15 DIAGNOSIS — Z3202 Encounter for pregnancy test, result negative: Secondary | ICD-10-CM

## 2022-03-15 DIAGNOSIS — Z789 Other specified health status: Secondary | ICD-10-CM | POA: Diagnosis not present

## 2022-03-15 DIAGNOSIS — Z3042 Encounter for surveillance of injectable contraceptive: Secondary | ICD-10-CM | POA: Diagnosis not present

## 2022-03-15 LAB — POCT URINE PREGNANCY: Preg Test, Ur: NEGATIVE

## 2022-03-15 MED ORDER — MEDROXYPROGESTERONE ACETATE 150 MG/ML IM SUSP
150.0000 mg | Freq: Once | INTRAMUSCULAR | Status: AC
  Administered 2022-03-15: 150 mg via INTRAMUSCULAR

## 2022-03-15 NOTE — Progress Notes (Signed)
Per orders of Dr. Alphonsus Sias, injection of Depo-Provera given by Nanci Pina. Patient tolerated injection well.  Pt had 2nd neg urine pregnancy test today. (See 03/01/22 OV notes)

## 2022-06-19 ENCOUNTER — Ambulatory Visit (INDEPENDENT_AMBULATORY_CARE_PROVIDER_SITE_OTHER): Payer: Federal, State, Local not specified - PPO

## 2022-06-19 DIAGNOSIS — Z308 Encounter for other contraceptive management: Secondary | ICD-10-CM

## 2022-06-19 LAB — POCT URINE PREGNANCY: Preg Test, Ur: NEGATIVE

## 2022-06-19 NOTE — Progress Notes (Signed)
Per orders of [employer], a urine pregnancy test was performed due to pt being 5 days overdue for Depo-provera. Test has a negative result.  Pt will return in 2 weeks to have another pregnancy test and, if negative will receive her depo-provera shot that day.

## 2022-07-03 ENCOUNTER — Ambulatory Visit (INDEPENDENT_AMBULATORY_CARE_PROVIDER_SITE_OTHER): Payer: Federal, State, Local not specified - PPO

## 2022-07-03 DIAGNOSIS — Z308 Encounter for other contraceptive management: Secondary | ICD-10-CM | POA: Diagnosis not present

## 2022-07-03 LAB — POCT URINE PREGNANCY: Preg Test, Ur: NEGATIVE

## 2022-07-03 MED ORDER — MEDROXYPROGESTERONE ACETATE 150 MG/ML IM SUSP
150.0000 mg | Freq: Once | INTRAMUSCULAR | Status: AC
  Administered 2022-07-03: 150 mg via INTRAMUSCULAR

## 2022-07-03 NOTE — Progress Notes (Signed)
Per orders of Allie Bossier, DNP, AGNP-C, injection of Depo given by Francella Solian in left upper outer quadrant after urine pregnancy test was ran per orders of Eugenia Pancoast, NP.  Patient tolerated injection well. Patient will make appointment for 09/17/2022 to1-19-2024 at check out.

## 2022-09-19 ENCOUNTER — Ambulatory Visit (INDEPENDENT_AMBULATORY_CARE_PROVIDER_SITE_OTHER): Payer: Federal, State, Local not specified - PPO

## 2022-09-19 DIAGNOSIS — Z308 Encounter for other contraceptive management: Secondary | ICD-10-CM

## 2022-09-19 MED ORDER — MEDROXYPROGESTERONE ACETATE 150 MG/ML IM SUSP
150.0000 mg | Freq: Once | INTRAMUSCULAR | Status: AC
  Administered 2022-09-19: 150 mg via INTRAMUSCULAR

## 2022-09-19 NOTE — Progress Notes (Signed)
Per orders of Allie Bossier, DGNP, patient given Depo-Provera injection to right ventrogluteal, given by Sherrilee Gilles, CMA. Patient voiced no concerns nor showed any signs of distress during injection.

## 2022-09-20 ENCOUNTER — Other Ambulatory Visit (HOSPITAL_COMMUNITY)
Admission: RE | Admit: 2022-09-20 | Discharge: 2022-09-20 | Disposition: A | Discharge status: Home / Self Care | Payer: Federal, State, Local not specified - PPO | Source: Ambulatory Visit | Attending: Family | Admitting: Family

## 2022-09-20 ENCOUNTER — Ambulatory Visit: Payer: Federal, State, Local not specified - PPO | Admitting: Family

## 2022-09-20 ENCOUNTER — Encounter: Payer: Self-pay | Admitting: Family

## 2022-09-20 VITALS — BP 112/68 | HR 88 | Temp 98.2°F | Ht 68.75 in | Wt 188.4 lb

## 2022-09-20 DIAGNOSIS — Z113 Encounter for screening for infections with a predominantly sexual mode of transmission: Secondary | ICD-10-CM | POA: Diagnosis not present

## 2022-09-20 DIAGNOSIS — R350 Frequency of micturition: Secondary | ICD-10-CM | POA: Diagnosis not present

## 2022-09-20 DIAGNOSIS — Z789 Other specified health status: Secondary | ICD-10-CM

## 2022-09-20 DIAGNOSIS — K649 Unspecified hemorrhoids: Secondary | ICD-10-CM | POA: Diagnosis not present

## 2022-09-20 MED ORDER — HYDROCORT-PRAMOXINE (PERIANAL) 2.5-1 % EX CREA: TOPICAL_CREAM | Freq: Two times a day (BID) | CUTANEOUS | 0 refills | Status: DC

## 2022-09-20 NOTE — Progress Notes (Signed)
New Patient Office Visit  Subjective:  Patient ID: Shelby Andrews, female    DOB: 1995/10/17  Age: 27 y.o. MRN: [id number]  CC:  Chief Complaint  Patient presents with   Establish Care    HPI Shelby Andrews is here for a transition of care visit. Oriented to practice routines and expectations.  Prior provider was: Dr. Waunita Schooner  Pt is with acute concerns.   Pap 06/14/21  Denies urinary urgency, dysuria, but does have frequency.   Wants STD testing New relationship May 2022 , does use condoms not always.   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 Not currently seeing a therapist. But thinks she is ok without one at this time.      09/20/2022    2:16 PM  GAD 7 : Generalized Anxiety Score  Nervous, Anxious, on Edge 1  Control/stop worrying 1  Worry too much - different things 1  Trouble relaxing 0  Restless 0  Easily annoyed or irritable 1  Afraid - awful might happen 0  Total GAD 7 Score 4  Anxiety Difficulty Not difficult at all       09/20/2022    2:16 PM 06/14/2021    8:51 AM 11/17/2019   11:33 AM  PHQ9 SCORE ONLY  PHQ-9 Total Score 5 0 0     ROS: Negative unless specifically indicated above in HPI.   Current Outpatient Medications:    ibuprofen (ADVIL) 600 MG tablet, Take 1 tablet (600 mg total) by mouth every 8 (eight) hours as needed., Disp: 30 tablet, Rfl: 0   medroxyPROGESTERone (DEPO-PROVERA) 150 MG/ML injection, Inject 150 mg into the muscle every 3 (three) months., Disp: , Rfl:    hydrocortisone-pramoxine (ANALPRAM-HC) 2.5-1 % rectal cream, Place rectally in the morning and at bedtime., Disp: 30 g, Rfl: 0 Past Medical History:  Diagnosis Date   ADHD (attention deficit hyperactivity disorder)    Formall evaluated by psychological services   Past Surgical History:  Procedure Laterality Date   NO PAST  SURGERIES      Objective:   Today's Vitals: BP 112/68   Pulse 88   Temp 98.2 F (36.8 C) (Oral)   Ht 5' 8.75" (1.746 m)   Wt 188 lb 6.4 oz (85.5 kg)   LMP 06/09/2022   SpO2 99%   BMI 28.02 kg/m   Physical Exam Constitutional:      General: She is not in acute distress.    Appearance: Normal appearance. She is normal weight. She is not ill-appearing, toxic-appearing or diaphoretic.  HENT:     Head: Normocephalic.  Cardiovascular:     Rate and Rhythm: Normal rate.  Pulmonary:     Effort: Pulmonary effort is normal.  Musculoskeletal:        General: Normal range of motion.  Neurological:     General: No focal deficit present.     Mental Status: She is alert and oriented to person, place, and time. Mental status is at baseline.  Psychiatric:        Mood and Affect: Mood normal.        Behavior: Behavior normal.        Thought Content: Thought content normal.        Judgment: Judgment normal.   Assessment and Plan  Urinary frequency -     Urine Culture -  POCT Urinalysis Dipstick (Automated)  Hemorrhoids, unspecified hemorrhoid type Assessment & Plan: Stable at current with intermittent flare up  Refill for perianal cream sent to pharmacy  Orders: -     Hydrocort-Pramoxine (Perianal); Place rectally in the morning and at bedtime.  Dispense: 30 g; Refill: 0  Screening for STDs (sexually transmitted diseases) -     RPR -     HIV Antibody (routine testing w rflx) -     Urine cytology ancillary only -     Hepatitis C antibody -     Herpes Simplex Virus 1 and 2 (IgG), with Reflex to HSV-2 Inhibition  Uses Depo-Provera as primary birth control method Assessment & Plan: Continue with depo as scheduled. Did d/w her five year plan for drug holiday however pt wishes to continue with depo despite risk for bone degeneration/osteoporosis. Will continue to re-evaluate at yearly intervals    Urinary frequency -     Urine Culture -     POCT Urinalysis Dipstick  (Automated)  Hemorrhoids, unspecified hemorrhoid type Assessment & Plan: Stable at current with intermittent flare up  Refill for perianal cream sent to pharmacy  Orders: -     Hydrocort-Pramoxine (Perianal); Place rectally in the morning and at bedtime.  Dispense: 30 g; Refill: 0  Screening for STDs (sexually transmitted diseases) -     RPR -     HIV Antibody (routine testing w rflx) -     Urine cytology ancillary only -     Hepatitis C antibody -     Herpes Simplex Virus 1 and 2 (IgG), with Reflex to HSV-2 Inhibition  Uses Depo-Provera as primary birth control method Assessment & Plan: Continue with depo as scheduled. Did d/w her five year plan for drug holiday however pt wishes to continue with depo despite risk for bone degeneration/osteoporosis. Will continue to re-evaluate at yearly intervals     Follow-up: Return in about 4 months (around 01/19/2023) for f/u PAP.   Eugenia Pancoast, FNP

## 2022-09-20 NOTE — Patient Instructions (Addendum)
  Welcome to our clinic, I am happy to have you as my new patient. I am excited to continue on this healthcare journey with you.  Stop by the lab prior to leaving today. I will notify you of your results once received.   Please keep in mind Any my chart messages you send have up to a three business day turnaround for a response.  Phone calls may take up to a one full business day turnaround for a  response.   If you need a medication refill I recommend you request it through the pharmacy as this is easiest for us rather than sending a message and or phone call.   Due to recent changes in healthcare laws, you may see results of your imaging and/or laboratory studies on MyChart before I have had a chance to review them.  I understand that in some cases there may be results that are confusing or concerning to you. Please understand that not all results are received at the same time and often I may need to interpret multiple results in order to provide you with the best plan of care or course of treatment. Therefore, I ask that you please give me 2 business days to thoroughly review all your results before contacting my office for clarification. Should we see a critical lab result, you will be contacted sooner.   It was a pleasure seeing you today! Please do not hesitate to reach out with any questions and or concerns.  Regards,   Layonna Dobie FNP-C  

## 2022-09-21 DIAGNOSIS — R350 Frequency of micturition: Secondary | ICD-10-CM | POA: Insufficient documentation

## 2022-09-21 LAB — HEPATITIS C ANTIBODY: Hepatitis C Ab: NONREACTIVE

## 2022-09-21 LAB — HERPES SIMPLEX VIRUS 1 AND 2 (IGG),REFLEX HSV-2 INHIBITION
HAV 1 IGG,TYPE SPECIFIC AB: <0.9 index
HSV 2 IGG,TYPE SPECIFIC AB: <0.9 index

## 2022-09-21 LAB — URINE CULTURE
MICRO NUMBER:: 14443794
SPECIMEN QUALITY:: ADEQUATE

## 2022-09-21 LAB — HIV ANTIBODY (ROUTINE TESTING W REFLEX): HIV 1&2 Ab, 4th Generation: NONREACTIVE

## 2022-09-21 LAB — RPR: RPR Ser Ql: NONREACTIVE

## 2022-09-21 NOTE — Assessment & Plan Note (Signed)
Continue with depo as scheduled. Did d/w her five year plan for drug holiday however pt wishes to continue with depo despite risk for bone degeneration/osteoporosis. Will continue to re-evaluate at yearly intervals

## 2022-09-21 NOTE — Assessment & Plan Note (Signed)
Stable at current with intermittent flare up  Refill for perianal cream sent to pharmacy

## 2022-09-24 LAB — URINE CYTOLOGY ANCILLARY ONLY
Bacterial Vaginitis-Urine: NEGATIVE
Candida Urine: NEGATIVE
Chlamydia: NEGATIVE
Comment: NEGATIVE
Comment: NEGATIVE
Comment: NORMAL
Neisseria Gonorrhea: NEGATIVE
Trichomonas: NEGATIVE

## 2022-11-12 ENCOUNTER — Encounter: Payer: Self-pay | Admitting: Family

## 2022-11-19 DIAGNOSIS — N760 Acute vaginitis: Secondary | ICD-10-CM | POA: Diagnosis not present

## 2022-11-19 DIAGNOSIS — Z01419 Encounter for gynecological examination (general) (routine) without abnormal findings: Secondary | ICD-10-CM | POA: Diagnosis not present

## 2022-11-19 DIAGNOSIS — Z113 Encounter for screening for infections with a predominantly sexual mode of transmission: Secondary | ICD-10-CM | POA: Diagnosis not present

## 2022-12-12 DIAGNOSIS — Z30430 Encounter for insertion of intrauterine contraceptive device: Secondary | ICD-10-CM | POA: Diagnosis not present

## 2022-12-12 DIAGNOSIS — Z3202 Encounter for pregnancy test, result negative: Secondary | ICD-10-CM | POA: Diagnosis not present

## 2022-12-19 ENCOUNTER — Ambulatory Visit: Payer: Federal, State, Local not specified - PPO

## 2023-01-23 DIAGNOSIS — Z30431 Encounter for routine checking of intrauterine contraceptive device: Secondary | ICD-10-CM | POA: Diagnosis not present

## 2023-02-13 DIAGNOSIS — M9905 Segmental and somatic dysfunction of pelvic region: Secondary | ICD-10-CM | POA: Diagnosis not present

## 2023-02-13 DIAGNOSIS — M5386 Other specified dorsopathies, lumbar region: Secondary | ICD-10-CM | POA: Diagnosis not present

## 2023-02-13 DIAGNOSIS — M9903 Segmental and somatic dysfunction of lumbar region: Secondary | ICD-10-CM | POA: Diagnosis not present

## 2023-02-13 DIAGNOSIS — M5441 Lumbago with sciatica, right side: Secondary | ICD-10-CM | POA: Diagnosis not present

## 2023-02-15 DIAGNOSIS — M5441 Lumbago with sciatica, right side: Secondary | ICD-10-CM | POA: Diagnosis not present

## 2023-02-15 DIAGNOSIS — M9903 Segmental and somatic dysfunction of lumbar region: Secondary | ICD-10-CM | POA: Diagnosis not present

## 2023-02-15 DIAGNOSIS — M5386 Other specified dorsopathies, lumbar region: Secondary | ICD-10-CM | POA: Diagnosis not present

## 2023-02-15 DIAGNOSIS — M9905 Segmental and somatic dysfunction of pelvic region: Secondary | ICD-10-CM | POA: Diagnosis not present

## 2023-02-15 DIAGNOSIS — M9906 Segmental and somatic dysfunction of lower extremity: Secondary | ICD-10-CM | POA: Diagnosis not present

## 2023-02-19 DIAGNOSIS — M5441 Lumbago with sciatica, right side: Secondary | ICD-10-CM | POA: Diagnosis not present

## 2023-02-19 DIAGNOSIS — M9903 Segmental and somatic dysfunction of lumbar region: Secondary | ICD-10-CM | POA: Diagnosis not present

## 2023-02-19 DIAGNOSIS — M9906 Segmental and somatic dysfunction of lower extremity: Secondary | ICD-10-CM | POA: Diagnosis not present

## 2023-02-19 DIAGNOSIS — M9905 Segmental and somatic dysfunction of pelvic region: Secondary | ICD-10-CM | POA: Diagnosis not present

## 2023-02-19 DIAGNOSIS — M5386 Other specified dorsopathies, lumbar region: Secondary | ICD-10-CM | POA: Diagnosis not present

## 2024-06-17 ENCOUNTER — Ambulatory Visit: Payer: Self-pay | Admitting: General Practice

## 2024-06-17 ENCOUNTER — Encounter: Payer: Self-pay | Admitting: General Practice

## 2024-06-17 ENCOUNTER — Ambulatory Visit: Admitting: General Practice

## 2024-06-17 VITALS — BP 114/80 | HR 65 | Temp 98.0°F | Ht 69.0 in | Wt 186.0 lb

## 2024-06-17 DIAGNOSIS — Z833 Family history of diabetes mellitus: Secondary | ICD-10-CM

## 2024-06-17 DIAGNOSIS — Z0000 Encounter for general adult medical examination without abnormal findings: Secondary | ICD-10-CM | POA: Diagnosis not present

## 2024-06-17 DIAGNOSIS — Z23 Encounter for immunization: Secondary | ICD-10-CM

## 2024-06-17 DIAGNOSIS — Z7689 Persons encountering health services in other specified circumstances: Secondary | ICD-10-CM | POA: Insufficient documentation

## 2024-06-17 LAB — CBC
HCT: 38.8 % (ref 36.0–46.0)
Hemoglobin: 12.8 g/dL (ref 12.0–15.0)
MCHC: 32.9 g/dL (ref 30.0–36.0)
MCV: 81.7 fl (ref 78.0–100.0)
Platelets: 355 K/uL (ref 150.0–400.0)
RBC: 4.75 Mil/uL (ref 3.87–5.11)
RDW: 13.5 % (ref 11.5–15.5)
WBC: 5 K/uL (ref 4.0–10.5)

## 2024-06-17 LAB — COMPREHENSIVE METABOLIC PANEL WITH GFR
ALT: 12 U/L (ref 0–35)
AST: 15 U/L (ref 0–37)
Albumin: 4.4 g/dL (ref 3.5–5.2)
Alkaline Phosphatase: 49 U/L (ref 39–117)
BUN: 7 mg/dL (ref 6–23)
CO2: 25 meq/L (ref 19–32)
Calcium: 9.2 mg/dL (ref 8.4–10.5)
Chloride: 104 meq/L (ref 96–112)
Creatinine, Ser: 0.81 mg/dL (ref 0.40–1.20)
GFR: 99.57 mL/min (ref >60.00)
Glucose, Bld: 85 mg/dL (ref 70–99)
Potassium: 4.1 meq/L (ref 3.5–5.1)
Sodium: 137 meq/L (ref 135–145)
Total Bilirubin: 0.6 mg/dL (ref 0.2–1.2)
Total Protein: 7 g/dL (ref 6.0–8.3)

## 2024-06-17 LAB — LIPID PANEL
Cholesterol: 142 mg/dL (ref 0–200)
HDL: 44.4 mg/dL (ref >39.00)
LDL Cholesterol: 85 mg/dL (ref 0–99)
NonHDL: 97.71
Total CHOL/HDL Ratio: 3
Triglycerides: 62 mg/dL (ref 0.0–149.0)
VLDL: 12.4 mg/dL (ref 0.0–40.0)

## 2024-06-17 LAB — HEMOGLOBIN A1C: Hgb A1c MFr Bld: 5.3 % (ref 4.6–6.5)

## 2024-06-17 LAB — TSH: TSH: 0.88 u[IU]/mL (ref 0.35–5.50)

## 2024-06-17 NOTE — Assessment & Plan Note (Signed)
 EMR reviewed briefly.

## 2024-06-17 NOTE — Assessment & Plan Note (Addendum)
 Immunizations flu shot given today; tdap and HPV UTD. Pap smear due; she will schedule with GYN.  Discussed the importance of a healthy diet and regular exercise in order for weight loss, and to reduce the risk of further co-morbidity.  Exam stable. Labs pending.  Follow up in 1 year for repeat physical.

## 2024-06-17 NOTE — Progress Notes (Signed)
 New Patient Office Visit  Subjective    Patient ID: Shelby Andrews, female    DOB: [birth year]  Age: 28 y.o. MRN: [id number]  CC:  Chief Complaint  Patient presents with   New Patient (Initial Visit)    TOC from Ginger Patrick, NP    HPI Shelby Andrews is a 28 y.o. female presents to establish care, for complete physical and follow up of chronic conditions. Previous PCP/physical/labs: Ginger Patrick, NP  Immunizations: -Tetanus: Completed in 2019 -Influenza: due -HPV: Completed  Diet: Fair diet.  Exercise: No regular exercise.  Eye exam: Completed several months ago Dental exam: Completes semi-annually    Pap Smear: Completed in 2022; she plans to schedule with GYN.   Outpatient Encounter Medications as of 06/17/2024  Medication Sig   ibuprofen  (ADVIL ) 600 MG tablet Take 1 tablet (600 mg total) by mouth every 8 (eight) hours as needed.   levonorgestrel  (KYLEENA ) 19.5 MG IUD PROVIDED BY [facility]ARE [facility]ENTER   Wheat Dextrin (BENEFIBER) POWD Take by mouth daily.   [DISCONTINUED] hydrocortisone -pramoxine (ANALPRAM-HC) 2.5-1 % rectal cream Place rectally in the morning and at bedtime.   [DISCONTINUED] medroxyPROGESTERone  (DEPO-PROVERA ) 150 MG/ML injection Inject 150 mg into the muscle every 3 (three) months.   No facility-administered encounter medications on file as of 06/17/2024.    Past Medical History:  Diagnosis Date   ADHD (attention deficit hyperactivity disorder)    Formall evaluated by psychological services    Past Surgical History:  Procedure Laterality Date   NO PAST SURGERIES      Family History  Problem Relation Age of Onset   Diabetes Mother    Healthy Father    Diabetes Maternal Grandmother    Hypertension Maternal Grandmother    Congestive Heart Failure Maternal Grandmother    Diabetes Maternal Grandfather    Hypertension Maternal Grandfather    Stroke Maternal Grandfather 32    Social History   Socioeconomic History   Marital status:  Significant Other    Spouse name: Not on file   Number of children: Not on file   Years of education: Not on file   Highest education level: Not on file  Occupational History    Comment: currently unemployed  Tobacco Use   Smoking status: Never   Smokeless tobacco: Never  Vaping Use   Vaping status: Never Used  Substance and Sexual Activity   Alcohol use: Yes    Comment: a few times a month, 2-3 drinks   Drug use: Yes    Frequency: 2.0 times per week    Types: Marijuana    Comment: trying to cut back   Sexual activity: Yes    Partners: Male    Birth control/protection: Injection, Condom    Comment: condoms everytime  Other Topics Concern   Not on file  Social History Narrative   11/17/19   From: [place]   Living: at home, with parents   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)   Recently terminated from Primary school teacher       Family: gets along well with family - oldest of 3 siblings      Enjoys: sleep, spend time with family/friends      Exercise: walking more with mom - walking around [place] [employer]   Diet: enjoys fish, natural juice (aloe, coconut water), popcorn      Safety   Seat belts: Yes    Guns: No   Safe in  relationships: Yes    Social Drivers of Corporate investment banker Strain: Not on file  Food Insecurity: Not on file  Transportation Needs: Not on file  Physical Activity: Not on file  Stress: Not on file  Social Connections: Not on file  Intimate Partner Violence: Not on file    Review of Systems  Constitutional:  Negative for chills, fever, malaise/fatigue and weight loss.  HENT:  Negative for congestion, ear discharge, ear pain, hearing loss, nosebleeds, sinus pain, sore throat and tinnitus.   Eyes:  Negative for blurred vision, double vision, pain, discharge and redness.  Respiratory:  Negative for cough, shortness of breath, wheezing and stridor.   Cardiovascular:   Negative for chest pain, palpitations and leg swelling.  Gastrointestinal:  Negative for abdominal pain, constipation, diarrhea, heartburn, nausea and vomiting.  Genitourinary:  Negative for dysuria, frequency and urgency.  Musculoskeletal:  Negative for myalgias.  Skin:  Negative for rash.  Neurological:  Negative for dizziness, tingling, seizures, weakness and headaches.  Psychiatric/Behavioral:  Negative for depression, substance abuse and suicidal ideas. The patient is not nervous/anxious.         Objective    BP 114/80   Pulse 65   Temp 98 F (36.7 C) (Oral)   Ht 5' 9 (1.753 m)   Wt 186 lb (84.4 kg)   LMP 06/17/2024 (Exact Date)   BMI 27.47 kg/m   Physical Exam Vitals and nursing note reviewed.  Constitutional:      Appearance: Normal appearance.  HENT:     Head: Normocephalic and atraumatic.     Right Ear: Tympanic membrane, ear canal and external ear normal.     Left Ear: Tympanic membrane, ear canal and external ear normal.     Nose: Nose normal.     Mouth/Throat:     Mouth: Mucous membranes are moist.     Pharynx: Oropharynx is clear.  Eyes:     Conjunctiva/sclera: Conjunctivae normal.     Pupils: Pupils are equal, round, and reactive to light.  Cardiovascular:     Rate and Rhythm: Normal rate and regular rhythm.     Pulses: Normal pulses.     Heart sounds: Normal heart sounds.  Pulmonary:     Effort: Pulmonary effort is normal.     Breath sounds: Normal breath sounds.  Abdominal:     General: Abdomen is flat. Bowel sounds are normal.     Palpations: Abdomen is soft.  Musculoskeletal:        General: Normal range of motion.     Cervical back: Normal range of motion.  Skin:    General: Skin is warm and dry.     Capillary Refill: Capillary refill takes less than 2 seconds.  Neurological:     General: No focal deficit present.     Mental Status: She is alert and oriented to person, place, and time. Mental status is at baseline.  Psychiatric:         Mood and Affect: Mood normal.        Behavior: Behavior normal.        Thought Content: Thought content normal.        Judgment: Judgment normal.         Assessment & Plan:  Establishing care with new doctor, encounter for Assessment & Plan: EMR reviewed briefly.    Family history of type 2 diabetes mellitus Assessment & Plan: Hemoglobin A1c pending.  Orders: -     Hemoglobin A1c  Encounter for  screening and preventative care Assessment & Plan: Immunizations flu shot given today; tdap and HPV UTD. Pap smear due; she will schedule with GYN.  Discussed the importance of a healthy diet and regular exercise in order for weight loss, and to reduce the risk of further co-morbidity.  Exam stable. Labs pending.  Follow up in 1 year for repeat physical.   Orders: -     CBC -     Comprehensive metabolic panel with GFR -     Lipid panel -     TSH    Return in about 1 year (around 06/17/2025) for physical and fasting labs.SABRA Carrol Aurora, NP

## 2024-06-17 NOTE — Patient Instructions (Signed)
 Stop by the lab prior to leaving today. I will notify you of your results once received.   Cut back on the cbd and marijuana.   It was a pleasure to meet you today! Please don't hesitate to contact me with any questions. Welcome to [facility]!

## 2024-06-17 NOTE — Assessment & Plan Note (Signed)
 Hemoglobin A1c pending

## 2024-09-15 ENCOUNTER — Ambulatory Visit
Admission: EM | Admit: 2024-09-15 | Discharge: 2024-09-15 | Disposition: A | Discharge status: Home / Self Care | Attending: Family Medicine | Admitting: Family Medicine

## 2024-09-15 DIAGNOSIS — S61306A Unspecified open wound of right little finger with damage to nail, initial encounter: Secondary | ICD-10-CM | POA: Diagnosis not present

## 2024-09-15 NOTE — ED Triage Notes (Signed)
 Pt states that she has a broken nail on her right pinky finger. X2 weeks Pt states that she still has a piece of the nail on her finger.

## 2024-09-15 NOTE — ED Provider Notes (Signed)
 " [facility]  Note:  This document was prepared using Conservation officer, historic buildings and may include unintentional dictation errors.  MRN: [id number] DOB: 10-06-1995  Subjective:   Shelby Andrews is a 29 y.o. female presenting for 2-week history of right pinky finger injury.  Patient has artificial nails and suffered an avulsion of the nail.  Her family was able to trim the nail as proximal as possible to the fingertip.  No fever, drainage of pus or bleeding, swelling.  Has kept the nail covered with Coban.  Current Outpatient Medications  Medication Instructions   ibuprofen  (ADVIL ) 600 mg, Oral, Every 8 hours PRN   levonorgestrel  (KYLEENA ) 19.5 MG IUD PROVIDED BY [facility]ARE [facility]   Wheat Dextrin (BENEFIBER) POWD Daily    Allergies[1]  Past Medical History:  Diagnosis Date   ADHD (attention deficit hyperactivity disorder)    Formall evaluated by psychological services     Past Surgical History:  Procedure Laterality Date   NO PAST SURGERIES      Family History  Problem Relation Age of Onset   Diabetes Mother    Healthy Father    Diabetes Maternal Grandmother    Hypertension Maternal Grandmother    Congestive Heart Failure Maternal Grandmother    Diabetes Maternal Grandfather    Hypertension Maternal Grandfather    Stroke Maternal Grandfather 24    Social History   Occupational History    Comment: currently unemployed  Tobacco Use   Smoking status: Never   Smokeless tobacco: Never  Vaping Use   Vaping status: Never Used  Substance and Sexual Activity   Alcohol use: Not Currently    Comment: a few times a month, 2-3 drinks   Drug use: Yes    Frequency: 2.0 times per week    Types: Marijuana    Comment: trying to cut back   Sexual activity: Yes    Partners: Male    Birth control/protection: Injection, Condom    Comment: condoms everytime     ROS   Objective:   Vitals: BP 127/83 (BP Location: Right Arm)   Pulse 62   Temp  98.8 F (37.1 C) (Oral)   Resp 17   Ht 5' 9 (1.753 m)   Wt 183 lb (83 kg)   LMP 09/03/2024   SpO2 93% Comment: decorative acrylic nails  BMI 27.02 kg/m   Physical Exam Constitutional:      General: She is not in acute distress.    Appearance: Normal appearance. She is well-developed. She is not ill-appearing, toxic-appearing or diaphoretic.  HENT:     Head: Normocephalic and atraumatic.     Nose: Nose normal.     Mouth/Throat:     Mouth: Mucous membranes are moist.  Eyes:     General: No scleral icterus.       Right eye: No discharge.        Left eye: No discharge.     Extraocular Movements: Extraocular movements intact.  Cardiovascular:     Rate and Rhythm: Normal rate.  Pulmonary:     Effort: Pulmonary effort is normal.  Musculoskeletal:       Hands:  Skin:    General: Skin is warm and dry.  Neurological:     General: No focal deficit present.     Mental Status: She is alert and oriented to person, place, and time.  Psychiatric:        Mood and Affect: Mood normal.  Behavior: Behavior normal.     A new dressing was placed using Coban to the right fifth finger.  Assessment and Plan :   PDMP not reviewed this encounter.  1. Avulsion of nail of right little finger      Recommended conservative management.  No signs of secondary infection.    [1] No Known Allergies    Christopher Savannah, PA-C 09/15/24 1730  "

## 2024-09-15 NOTE — Discharge Instructions (Addendum)
 Keep covering the nail exposing 30 to 60 minutes at time when you can keep it protected. This would allow the nail bed to breathe. Otherwise, wash with warm soapy water and keep covered with Coban. As the nail grows out, trim close to the finger tip. Look out for fever, redness, hot sensation, drainage of pus as these are signs of infection.

## 2024-09-16 ENCOUNTER — Ambulatory Visit

## 2024-09-22 ENCOUNTER — Other Ambulatory Visit: Payer: Self-pay

## 2024-09-22 ENCOUNTER — Ambulatory Visit
Admission: EM | Admit: 2024-09-22 | Discharge: 2024-09-22 | Disposition: A | Discharge status: Home / Self Care | Attending: Physician Assistant | Admitting: Physician Assistant

## 2024-09-22 DIAGNOSIS — L03011 Cellulitis of right finger: Secondary | ICD-10-CM

## 2024-09-22 DIAGNOSIS — S61309A Unspecified open wound of unspecified finger with damage to nail, initial encounter: Secondary | ICD-10-CM | POA: Diagnosis not present

## 2024-09-22 MED ORDER — FLUCONAZOLE 150 MG PO TABS: 150.0000 mg | ORAL_TABLET | ORAL | 0 refills | Status: AC | PRN

## 2024-09-22 MED ORDER — CEPHALEXIN 500 MG PO CAPS: 500.0000 mg | ORAL_CAPSULE | Freq: Four times a day (QID) | ORAL | 0 refills | Status: AC

## 2024-09-22 NOTE — ED Triage Notes (Signed)
 Pt presents with concerns of discoloration + swelling of the right pinky finger. Finger is warm to the touch per pt description. Denies pain. Was recently seen at [facility] on 1/13. Exam was reassuring then. Pt states these are new sxs as of last night.

## 2024-09-22 NOTE — ED Provider Notes (Signed)
 " [facility]    CSN: [id number] Arrival date & time: 09/22/24  1655      History   Chief Complaint Chief Complaint  Patient presents with   Nail Problem   Follow-up    HPI Shelby Andrews is a 29 y.o. female.   HPI  Pt is here today for concerns of a broken finger nail. She states the skin around her nail bed has become swollen and painful. She was seen 09/15/24 at another UC and finger did not have current appearance at that time. She reports she has been cleaning and wrapping it to support it. She denies drainage or bleeding from the affected area.   Past Medical History:  Diagnosis Date   ADHD (attention deficit hyperactivity disorder)    [place] evaluated by psychological services    Patient Active Problem List   Diagnosis Date Noted   Establishing care with new doctor, encounter for 06/17/2024   Family history of type 2 diabetes mellitus 06/17/2024   Encounter for screening and preventative care 06/17/2024   Urinary frequency 09/21/2022   Uses Depo-Provera  as primary birth control method 03/01/2022   Hemorrhoids 03/10/2020   Menorrhagia 11/30/2015    Past Surgical History:  Procedure Laterality Date   NO PAST SURGERIES      OB History   No obstetric history on file.      Home Medications    Prior to Admission medications  Medication Sig Start Date End Date Taking? Authorizing Provider  cephALEXin  (KEFLEX ) 500 MG capsule Take 1 capsule (500 mg total) by mouth 4 (four) times daily for 7 days. 09/22/24 09/29/24 Yes Loletta Harper E, PA-C  fluconazole  (DIFLUCAN ) 150 MG tablet Take 1 tablet (150 mg total) by mouth every three (3) days as needed. May repeat in 3 days if symptoms not resolved 09/22/24  Yes Sivan Quast E, PA-C  ibuprofen  (ADVIL ) 600 MG tablet Take 1 tablet (600 mg total) by mouth every 8 (eight) hours as needed. 05/04/21   Prentiss Annabella LABOR, NP  levonorgestrel  (KYLEENA ) 19.5 MG IUD PROVIDED BY [facility] 12/12/22   [provider]   Wheat Dextrin (BENEFIBER) POWD Take by mouth daily.    [provider]    Family History Family History  Problem Relation Age of Onset   Diabetes Mother    Healthy Father    Diabetes Maternal Grandmother    Hypertension Maternal Grandmother    Congestive Heart Failure Maternal Grandmother    Diabetes Maternal Grandfather    Hypertension Maternal Grandfather    Stroke Maternal Grandfather 19    Social History Social History[1]   Allergies   Patient has no known allergies.   Review of Systems Review of Systems  Musculoskeletal:        Right little finger, pain around nail bed and broken nail       Physical Exam Triage Vital Signs ED Triage Vitals  Encounter Vitals Group     BP 09/22/24 1718 (!) 141/96     Girls Systolic BP Percentile --      Girls Diastolic BP Percentile --      Boys Systolic BP Percentile --      Boys Diastolic BP Percentile --      Pulse Rate 09/22/24 1718 78     Resp 09/22/24 1718 16     Temp 09/22/24 1718 98.6 F (37 C)     Temp Source 09/22/24 1718 Oral     SpO2 09/22/24 1718 98 %  Weight 09/22/24 1718 183 lb (83 kg)     Height 09/22/24 1718 5' 9 (1.753 m)     Head Circumference --      Peak Flow --      Pain Score 09/22/24 1735 0     Pain Loc --      Pain Education --      Exclude from Growth Chart --    No data found.  Updated Vital Signs BP (!) 141/96 (BP Location: Right Arm)   Pulse 78   Temp 98.6 F (37 C) (Oral)   Resp 16   Ht 5' 9 (1.753 m)   Wt 183 lb (83 kg)   LMP 09/03/2024   SpO2 98%   BMI 27.02 kg/m   Visual Acuity Right Eye Distance:   Left Eye Distance:   Bilateral Distance:    Right Eye Near:   Left Eye Near:    Bilateral Near:     Physical Exam Vitals reviewed.  Constitutional:      General: She is awake.     Appearance: Normal appearance. She is well-developed and well-groomed.  HENT:     Head: Normocephalic and atraumatic.  Eyes:     General: Lids are normal. Gaze aligned  appropriately.     Extraocular Movements: Extraocular movements intact.     Conjunctiva/sclera: Conjunctivae normal.  Pulmonary:     Effort: Pulmonary effort is normal.  Musculoskeletal:       Hands:  Neurological:     Mental Status: She is alert and oriented to person, place, and time.  Psychiatric:        Attention and Perception: Attention and perception normal.        Mood and Affect: Mood and affect normal.        Speech: Speech normal.        Behavior: Behavior normal. Behavior is cooperative.      UC Treatments / Results  Labs (all labs ordered are listed, but only abnormal results are displayed) Labs Reviewed - No data to display  EKG   Radiology No results found.  Procedures Procedures (including critical care time)  Medications Ordered in UC Medications - No data to display  Initial Impression / Assessment and Plan / UC Course  I have reviewed the triage vital signs and the nursing notes.  Pertinent labs & imaging results that were available during my care of the patient were reviewed by me and considered in my medical decision making (see chart for details).      Final Clinical Impressions(s) / UC Diagnoses   Final diagnoses:  Avulsion of fingernail, initial encounter  Infection of nail bed of finger of right hand   Patient is here today with concerns of broken fingernail and swelling along the right little finger.  She was previously seen on 09/15/2024 at another urgent care but did not have the swelling and erythema at the distal aspect of her right little finger at that time.  She reports that this has gotten worse and the area is very painful to palpation.  Concern for potential bacterial infection given nail avulsion.  Will start Keflex  500 mg p.o. 4 times daily x 7 days.  Recommend warm water Epsom salt soaks and keeping area clean and dry as able and tolerated.  Patient voices agreement understanding with recommendations.  ED and return precautions  reviewed and provided in AVS.  Follow-up as needed.     Discharge Instructions      You were seen  today for concerns of swelling and discoloration at the nailbed of your right little finger following an injury to your nail.  At this time I suspect you may have sustained a slight infection in the space underneath your nail which has caused the discoloration and swelling.  To treat this I am starting you on an antibiotic called Keflex  that you should take by mouth 4 times per day for 7 days.  Please finish the entire course of the antibiotic unless he develop an allergic reaction or if a medical provider tells you to stop.  You can use Tylenol and ibuprofen  as needed for pain control.  To further assist with keeping area clean please use a warm water and soap soak.  You can also use a warm water Epsom salt soak and then pat the area to dry.  If you start to notice increased swelling, difficulty moving your finger, drainage that looks like pus or bleeding please return here or go to the ER for further evaluation.     ED Prescriptions     Medication Sig Dispense Auth. Provider   cephALEXin  (KEFLEX ) 500 MG capsule Take 1 capsule (500 mg total) by mouth 4 (four) times daily for 7 days. 28 capsule Demetria Iwai E, PA-C   fluconazole  (DIFLUCAN ) 150 MG tablet Take 1 tablet (150 mg total) by mouth every three (3) days as needed. May repeat in 3 days if symptoms not resolved 2 tablet Halana Deisher E, PA-C      PDMP not reviewed this encounter.     [1]  Social History Tobacco Use   Smoking status: Never   Smokeless tobacco: Never  Vaping Use   Vaping status: Never Used  Substance Use Topics   Alcohol use: Not Currently    Comment: a few times a month, 2-3 drinks   Drug use: Yes    Frequency: 2.0 times per week    Types: Marijuana    Comment: trying to cut back     Marylene Rocky BRAVO, PA-C 09/25/24 0847  "

## 2024-09-22 NOTE — Discharge Instructions (Addendum)
 You were seen today for concerns of swelling and discoloration at the nailbed of your right little finger following an injury to your nail.  At this time I suspect you may have sustained a slight infection in the space underneath your nail which has caused the discoloration and swelling.  To treat this I am starting you on an antibiotic called Keflex  that you should take by mouth 4 times per day for 7 days.  Please finish the entire course of the antibiotic unless he develop an allergic reaction or if a medical provider tells you to stop.  You can use Tylenol and ibuprofen  as needed for pain control.  To further assist with keeping area clean please use a warm water and soap soak.  You can also use a warm water Epsom salt soak and then pat the area to dry.  If you start to notice increased swelling, difficulty moving your finger, drainage that looks like pus or bleeding please return here or go to the ER for further evaluation.

## 2025-06-18 ENCOUNTER — Encounter: Admitting: General Practice
